# Patient Record
Sex: Female | Born: 1975 | Race: Black or African American | Hispanic: No | Marital: Married | Smoking: Never smoker
Health system: Southern US, Community
[De-identification: ages and names within clinical notes are randomized; demographics above are authoritative.]

## PROBLEM LIST (undated history)

## (undated) DIAGNOSIS — D219 Benign neoplasm of connective and other soft tissue, unspecified: Secondary | ICD-10-CM

## (undated) DIAGNOSIS — J45909 Unspecified asthma, uncomplicated: Secondary | ICD-10-CM

## (undated) HISTORY — DX: Benign neoplasm of connective and other soft tissue, unspecified: D21.9

## (undated) HISTORY — DX: Unspecified asthma, uncomplicated: J45.909

## (undated) HISTORY — PX: OTHER SURGICAL HISTORY: SHX169

---

## 1998-06-22 ENCOUNTER — Ambulatory Visit (HOSPITAL_BASED_OUTPATIENT_CLINIC_OR_DEPARTMENT_OTHER): Admission: RE | Admit: 1998-06-22 | Discharge: 1998-06-22 | Payer: Self-pay | Admitting: Orthopaedic Surgery

## 1999-01-04 ENCOUNTER — Ambulatory Visit (HOSPITAL_BASED_OUTPATIENT_CLINIC_OR_DEPARTMENT_OTHER): Admission: RE | Admit: 1999-01-04 | Discharge: 1999-01-04 | Payer: Self-pay | Admitting: Orthopaedic Surgery

## 1999-07-09 ENCOUNTER — Emergency Department (HOSPITAL_COMMUNITY): Admission: EM | Admit: 1999-07-09 | Discharge: 1999-07-09 | Payer: Self-pay | Admitting: Emergency Medicine

## 2001-11-26 ENCOUNTER — Other Ambulatory Visit: Admission: RE | Admit: 2001-11-26 | Discharge: 2001-11-26 | Payer: Self-pay | Admitting: Obstetrics and Gynecology

## 2002-03-08 ENCOUNTER — Emergency Department (HOSPITAL_COMMUNITY): Admission: EM | Admit: 2002-03-08 | Discharge: 2002-03-08 | Payer: Self-pay | Admitting: Emergency Medicine

## 2002-06-11 ENCOUNTER — Other Ambulatory Visit: Admission: RE | Admit: 2002-06-11 | Discharge: 2002-06-11 | Payer: Self-pay | Admitting: Obstetrics and Gynecology

## 2002-11-03 ENCOUNTER — Ambulatory Visit (HOSPITAL_BASED_OUTPATIENT_CLINIC_OR_DEPARTMENT_OTHER): Admission: RE | Admit: 2002-11-03 | Discharge: 2002-11-03 | Payer: Self-pay | Admitting: Orthopedic Surgery

## 2003-06-19 ENCOUNTER — Other Ambulatory Visit: Admission: RE | Admit: 2003-06-19 | Discharge: 2003-06-19 | Payer: Self-pay | Admitting: Obstetrics and Gynecology

## 2003-07-24 ENCOUNTER — Ambulatory Visit (HOSPITAL_COMMUNITY): Admission: RE | Admit: 2003-07-24 | Discharge: 2003-07-24 | Payer: Self-pay | Admitting: Family Medicine

## 2003-08-29 HISTORY — PX: WISDOM TOOTH EXTRACTION: SHX21

## 2003-09-03 ENCOUNTER — Inpatient Hospital Stay (HOSPITAL_COMMUNITY): Admission: AD | Admit: 2003-09-03 | Discharge: 2003-09-03 | Payer: Self-pay | Admitting: Obstetrics and Gynecology

## 2003-09-04 ENCOUNTER — Ambulatory Visit (HOSPITAL_COMMUNITY): Admission: RE | Admit: 2003-09-04 | Discharge: 2003-09-04 | Payer: Self-pay | Admitting: Obstetrics and Gynecology

## 2014-09-08 ENCOUNTER — Emergency Department (INDEPENDENT_AMBULATORY_CARE_PROVIDER_SITE_OTHER)
Admission: EM | Admit: 2014-09-08 | Discharge: 2014-09-08 | Disposition: A | Discharge status: Home / Self Care | Payer: Self-pay | Source: Home / Self Care | Attending: Family Medicine | Admitting: Family Medicine

## 2014-09-08 ENCOUNTER — Encounter (HOSPITAL_COMMUNITY): Payer: Self-pay | Admitting: Emergency Medicine

## 2014-09-08 DIAGNOSIS — J00 Acute nasopharyngitis [common cold]: Secondary | ICD-10-CM

## 2014-09-08 MED ORDER — BENZONATATE 100 MG PO CAPS: 100.0000 mg | ORAL_CAPSULE | Freq: Three times a day (TID) | ORAL | Status: DC | PRN

## 2014-09-08 MED ORDER — IPRATROPIUM BROMIDE 0.06 % NA SOLN: 2.0000 | Freq: Four times a day (QID) | NASAL | Status: DC

## 2014-09-08 NOTE — ED Notes (Signed)
Generally not feeling well, sore throat, head hurts, no ear pain.  Unknown if she has run a fever, no appetite, no vomiting.  Onset 2 weeks ago

## 2014-09-08 NOTE — ED Provider Notes (Signed)
CSN: [id number]     Arrival date & time 09/08/14  0109 History   First MD Initiated Contact with Patient 09/08/14 1018     Chief Complaint  Patient presents with  . URI   (Consider location/radiation/quality/duration/timing/severity/associated sxs/prior Treatment) HPI Comments: Nonsmoker Works in childcare PCP: none No flu shot this year  Patient is a 39 y.o. female presenting with URI. The history is provided by the patient.  URI Presenting symptoms: congestion, cough, fatigue and rhinorrhea   Presenting symptoms: no ear pain, no facial pain, no fever and no sore throat   Severity:  Mild Onset quality:  Gradual Duration:  2 days Timing:  Constant Progression:  Unchanged Chronicity:  New Associated symptoms: sneezing   Associated symptoms: no arthralgias, no headaches, no myalgias, no neck pain, no sinus pain, no swollen glands and no wheezing   Risk factors: sick contacts     History reviewed. No pertinent past medical history. History reviewed. No pertinent past surgical history. No family history on file. History  Substance Use Topics  . Smoking status: Never Smoker   . Smokeless tobacco: Not on file  . Alcohol Use: No   OB History    No data available     Review of Systems  Constitutional: Positive for fatigue. Negative for fever.  HENT: Positive for congestion, rhinorrhea and sneezing. Negative for ear pain and sore throat.   Respiratory: Positive for cough. Negative for wheezing.   Musculoskeletal: Negative for myalgias, arthralgias and neck pain.  Neurological: Negative for headaches.  All other systems reviewed and are negative.   Allergies  Review of patient's allergies indicates no known allergies.  Home Medications   Prior to Admission medications   Medication Sig Start Date End Date Taking? Authorizing Provider  aspirin-sod bicarb-citric acid (ALKA-SELTZER) 325 MG TBEF tablet Take 325 mg by mouth every 6 (six) hours as needed.   Yes Historical  Provider, MD  Chlorphen-Pseudoephed-APAP (TYLENOL ALLERGY SINUS PO) Take by mouth.   Yes Historical Provider, MD  guaiFENesin (TUSSIN) 100 MG/5ML liquid Take 200 mg by mouth 3 (three) times daily as needed for cough.   Yes Historical Provider, MD  benzonatate (TESSALON) 100 MG capsule Take 1 capsule (100 mg total) by mouth 3 (three) times daily as needed for cough. 09/08/14   Audelia Hives Presson, PA  ipratropium (ATROVENT) 0.06 % nasal spray Place 2 sprays into both nostrils 4 (four) times daily. For nasal congestion 09/08/14   Annett Gula H Presson, PA   BP 114/77 mmHg  Pulse 81  Temp(Src) 98.6 F (37 C) (Oral)  Resp 12  SpO2 99%  LMP 08/24/2014 Physical Exam  Constitutional: She is oriented to person, place, and time. She appears well-developed and well-nourished. No distress.  HENT:  Head: Normocephalic and atraumatic.  Right Ear: Hearing, tympanic membrane, external ear and ear canal normal.  Left Ear: Hearing, tympanic membrane, external ear and ear canal normal.  Nose: Nose normal.  Mouth/Throat: Uvula is midline, oropharynx is clear and moist and mucous membranes are normal.  Eyes: Conjunctivae are normal. No scleral icterus.  Neck: Normal range of motion. Neck supple.  Cardiovascular: Normal rate, regular rhythm and normal heart sounds.   Pulmonary/Chest: Effort normal and breath sounds normal.  Musculoskeletal: Normal range of motion.  Lymphadenopathy:    She has no cervical adenopathy.  Neurological: She is alert and oriented to person, place, and time.  Skin: Skin is warm and dry.  Psychiatric: She has a normal mood and affect. Her  behavior is normal.  Nursing note and vitals reviewed.   ED Course  Procedures (including critical care time) Labs Review Labs Reviewed - No data to display  Imaging Review No results found.   MDM   1. Common cold   Symptomatic care at home.     Lutricia Feil, [place] 09/08/14 1053

## 2014-09-08 NOTE — Discharge Instructions (Signed)
Upper Respiratory Infection, Adult An upper respiratory infection (URI) is also sometimes known as the common cold. The upper respiratory tract includes the nose, sinuses, throat, trachea, and bronchi. Bronchi are the airways leading to the lungs. Most people improve within 1 week, but symptoms can last up to 2 weeks. A residual cough may last even longer.  CAUSES Many different viruses can infect the tissues lining the upper respiratory tract. The tissues become irritated and inflamed and often become very moist. Mucus production is also common. A cold is contagious. You can easily spread the virus to others by oral contact. This includes kissing, sharing a glass, coughing, or sneezing. Touching your mouth or nose and then touching a surface, which is then touched by another person, can also spread the virus. SYMPTOMS  Symptoms typically develop 1 to 3 days after you come in contact with a cold virus. Symptoms vary from person to person. They may include:  Runny nose.  Sneezing.  Nasal congestion.  Sinus irritation.  Sore throat.  Loss of voice (laryngitis).  Cough.  Fatigue.  Muscle aches.  Loss of appetite.  Headache.  Low-grade fever. DIAGNOSIS  You might diagnose your own cold based on familiar symptoms, since most people get a cold 2 to 3 times a year. Your caregiver can confirm this based on your exam. Most importantly, your caregiver can check that your symptoms are not due to another disease such as strep throat, sinusitis, pneumonia, asthma, or epiglottitis. Blood tests, throat tests, and X-rays are not necessary to diagnose a common cold, but they may sometimes be helpful in excluding other more serious diseases. Your caregiver will decide if any further tests are required. RISKS AND COMPLICATIONS  You may be at risk for a more severe case of the common cold if you smoke cigarettes, have chronic heart disease (such as heart failure) or lung disease (such as asthma), or if  you have a weakened immune system. The very young and very old are also at risk for more serious infections. Bacterial sinusitis, middle ear infections, and bacterial pneumonia can complicate the common cold. The common cold can worsen asthma and chronic obstructive pulmonary disease (COPD). Sometimes, these complications can require emergency medical care and may be life-threatening. PREVENTION  The best way to protect against getting a cold is to practice good hygiene. Avoid oral or hand contact with people with cold symptoms. Wash your hands often if contact occurs. There is no clear evidence that vitamin C, vitamin E, echinacea, or exercise reduces the chance of developing a cold. However, it is always recommended to get plenty of rest and practice good nutrition. TREATMENT  Treatment is directed at relieving symptoms. There is no cure. Antibiotics are not effective, because the infection is caused by a virus, not by bacteria. Treatment may include:  Increased fluid intake. Sports drinks offer valuable electrolytes, sugars, and fluids.  Breathing heated mist or steam (vaporizer or shower).  Eating chicken soup or other clear broths, and maintaining good nutrition.  Getting plenty of rest.  Using gargles or lozenges for comfort.  Controlling fevers with ibuprofen or acetaminophen as directed by your caregiver.  Increasing usage of your inhaler if you have asthma. Zinc gel and zinc lozenges, taken in the first 24 hours of the common cold, can shorten the duration and lessen the severity of symptoms. Pain medicines may help with fever, muscle aches, and throat pain. A variety of non-prescription medicines are available to treat congestion and runny nose. Your caregiver   can make recommendations and may suggest nasal or lung inhalers for other symptoms.  HOME CARE INSTRUCTIONS   Only take over-the-counter or prescription medicines for pain, discomfort, or fever as directed by your  caregiver.  Use a warm mist humidifier or inhale steam from a shower to increase air moisture. This may keep secretions moist and make it easier to breathe.  Drink enough water and fluids to keep your urine clear or pale yellow.  Rest as needed.  Return to work when your temperature has returned to normal or as your caregiver advises. You may need to stay home longer to avoid infecting others. You can also use a face mask and careful hand washing to prevent spread of the virus. SEEK MEDICAL CARE IF:   After the first few days, you feel you are getting worse rather than better.  You need your caregiver's advice about medicines to control symptoms.  You develop chills, worsening shortness of breath, or brown or red sputum. These may be signs of pneumonia.  You develop yellow or brown nasal discharge or pain in the face, especially when you bend forward. These may be signs of sinusitis.  You develop a fever, swollen neck glands, pain with swallowing, or white areas in the back of your throat. These may be signs of strep throat. SEEK IMMEDIATE MEDICAL CARE IF:   You have a fever.  You develop severe or persistent headache, ear pain, sinus pain, or chest pain.  You develop wheezing, a prolonged cough, cough up blood, or have a change in your usual mucus (if you have chronic lung disease).  You develop sore muscles or a stiff neck. Document Released: 02/07/2001 Document Revised: 11/06/2011 Document Reviewed: 11/19/2013 Exit[employer] Patient Information 2015 [employer]. This information is not intended to replace advice given to you by your health care provider. Make sure you discuss any questions you have with your health care provider.  

## 2017-01-27 ENCOUNTER — Ambulatory Visit (HOSPITAL_COMMUNITY)
Admission: EM | Admit: 2017-01-27 | Discharge: 2017-01-27 | Disposition: A | Discharge status: Home / Self Care | Payer: BC Managed Care – PPO | Attending: Internal Medicine | Admitting: Internal Medicine

## 2017-01-27 ENCOUNTER — Encounter (HOSPITAL_COMMUNITY): Payer: Self-pay | Admitting: Emergency Medicine

## 2017-01-27 DIAGNOSIS — M62830 Muscle spasm of back: Secondary | ICD-10-CM

## 2017-01-27 DIAGNOSIS — M545 Low back pain, unspecified: Secondary | ICD-10-CM

## 2017-01-27 MED ORDER — METHYLPREDNISOLONE ACETATE 80 MG/ML IJ SUSP
INTRAMUSCULAR | Status: AC
  Filled 2017-01-27: qty 1

## 2017-01-27 MED ORDER — KETOROLAC TROMETHAMINE 60 MG/2ML IM SOLN
60.0000 mg | Freq: Once | INTRAMUSCULAR | Status: AC
  Administered 2017-01-27: 60 mg via INTRAMUSCULAR

## 2017-01-27 MED ORDER — METHYLPREDNISOLONE ACETATE 80 MG/ML IJ SUSP
80.0000 mg | Freq: Once | INTRAMUSCULAR | Status: AC
  Administered 2017-01-27: 80 mg via INTRAMUSCULAR

## 2017-01-27 MED ORDER — CYCLOBENZAPRINE HCL 5 MG PO TABS: ORAL_TABLET | ORAL | 0 refills | Status: DC

## 2017-01-27 MED ORDER — HYDROCODONE-ACETAMINOPHEN 5-325 MG PO TABS: 1.0000 | ORAL_TABLET | Freq: Four times a day (QID) | ORAL | 0 refills | Status: DC | PRN

## 2017-01-27 MED ORDER — KETOROLAC TROMETHAMINE 60 MG/2ML IM SOLN
INTRAMUSCULAR | Status: AC
  Filled 2017-01-27: qty 2

## 2017-01-27 NOTE — ED Provider Notes (Signed)
CSN: [id number]     Arrival date & time 01/27/17  1559 History   First MD Initiated Contact with Patient 01/27/17 1623     Chief Complaint  Patient presents with  . Back Pain   (Consider location/radiation/quality/duration/timing/severity/associated sxs/prior Treatment) 41 yo black female presents with low back pain that began this morning after a "hard sneeze". She notes severe pain across lower back. No radiation into lower extremities. No numbness, tingling or weakness. She has pain with any movement. No prior history of back pain or injury .No loss of bowel or bladder control.       History reviewed. No pertinent past medical history. History reviewed. No pertinent surgical history. No family history on file. Social History  Substance Use Topics  . Smoking status: Never Smoker  . Smokeless tobacco: Never Used  . Alcohol use No   OB History    No data available     Review of Systems  Musculoskeletal: Positive for back pain.  Skin: Negative.   Neurological: Negative for weakness.    Allergies  Patient has no known allergies.  Home Medications   Prior to Admission medications   Medication Sig Start Date End Date Taking? Authorizing Provider  aspirin-sod bicarb-citric acid (ALKA-SELTZER) 325 MG TBEF tablet Take 325 mg by mouth every 6 (six) hours as needed.    [provider]  benzonatate (TESSALON) 100 MG capsule Take 1 capsule (100 mg total) by mouth 3 (three) times daily as needed for cough. 09/08/14   Presson, Audelia Hives, PA  Chlorphen-Pseudoephed-APAP (TYLENOL ALLERGY SINUS PO) Take by mouth.    [provider]  cyclobenzaprine (FLEXERIL) 5 MG tablet 1-2 po every 8 hours as needed for muscle spasms 01/27/17   Bjorn Pippin, PA-C  guaiFENesin (TUSSIN) 100 MG/5ML liquid Take 200 mg by mouth 3 (three) times daily as needed for cough.    [provider]  HYDROcodone-acetaminophen (NORCO/VICODIN) 5-325 MG tablet Take 1-2 tablets by mouth  every 6 (six) hours as needed for severe pain. 01/27/17   Bjorn Pippin, PA-C  ipratropium (ATROVENT) 0.06 % nasal spray Place 2 sprays into both nostrils 4 (four) times daily. For nasal congestion 09/08/14   Presson, Audelia Hives, PA   Meds Ordered and Administered this Visit   Medications  methylPREDNISolone acetate (DEPO-MEDROL) injection 80 mg (not administered)  ketorolac (TORADOL) injection 60 mg (not administered)    BP 125/81 (BP Location: Left Arm)   Pulse 78   Temp 98.7 F (37.1 C) (Oral)   Resp 20   LMP 01/24/2017   SpO2 100%  No data found.   Physical Exam  Constitutional: She is oriented to person, place, and time. She appears well-developed and well-nourished.  Patient appears in pain given position on exam table and inability to get comfortable  Musculoskeletal: She exhibits no tenderness or deformity.  Decrease ROM of the lumbar spine due to pain and poor effort in the setting of pain. No specific spasm noted, no spinal tenderness. Negative SLR  Neurological: She is alert and oriented to person, place, and time. She displays normal reflexes. She exhibits normal muscle tone. Coordination normal.  Motor 5/5 BLE  Skin: Skin is warm and dry.  Psychiatric: Her behavior is normal.  Nursing note and vitals reviewed.   Urgent Care Course     Procedures (including critical care time)  Labs Review Labs Reviewed - No data to display  Imaging Review No results found.   Visual Acuity Review  Right  Eye Distance:   Left Eye Distance:   Bilateral Distance:    Right Eye Near:   Left Eye Near:    Bilateral Near:         MDM   1. Muscle spasm of back   2. Acute midline low back pain without sciatica    No neuro deficit. Treat acutely with 80 mg IM DepoMedrol, 60 mg IM Toradol accompanied by Norco and Flexeril as needed. Discussed gentle stretching as well. If she worsens f/u with Orthopedics may need PT as well.     Bjorn Pippin, [place] 01/27/17  1651

## 2017-01-27 NOTE — ED Triage Notes (Signed)
Here for lower back back pain since 0900.... sts she sneezed and felt the pain since   Pain increases w/activity  A&O x4... NAD... Ambulatory

## 2017-01-27 NOTE — Discharge Instructions (Signed)
Lay on a hard surface, Biofreeze OTC is good and ice to heat to use as well. The shots will help with inflammation. Also may use Ibuprofen 800 mg every 8 hours (start tomorrow) for inflammation, use Flexeril as needed for spasms and Norco for acute pain. If you continue to worsen then f/u with Orthopedics for further evaluation and possible physical therapy.

## 2017-02-02 ENCOUNTER — Ambulatory Visit (INDEPENDENT_AMBULATORY_CARE_PROVIDER_SITE_OTHER): Payer: Self-pay

## 2017-02-02 ENCOUNTER — Ambulatory Visit (INDEPENDENT_AMBULATORY_CARE_PROVIDER_SITE_OTHER): Payer: BC Managed Care – PPO | Admitting: Family

## 2017-02-02 VITALS — Ht 61.0 in | Wt 130.0 lb

## 2017-02-02 DIAGNOSIS — M545 Low back pain, unspecified: Secondary | ICD-10-CM

## 2017-02-02 MED ORDER — HYDROCODONE-ACETAMINOPHEN 5-325 MG PO TABS: 1.0000 | ORAL_TABLET | Freq: Four times a day (QID) | ORAL | 0 refills | Status: DC | PRN

## 2017-02-02 NOTE — Progress Notes (Signed)
Office Visit Note   Patient: Rachel Lawrence           Date of Birth: 12/05/1975           MRN: [id number] Visit Date: 02/02/2017              Requested by: Velna Hatchet, [place], [place] [address] PCP: [facility] [place]  Chief Complaint  Patient presents with  . Lower Back - Pain    Sneezed last Friday acute onset LBP      HPI: Patient is a 41 year old woman who presents today complaining of low back pain. She did have an emergency room visit about a week ago for the same. States that she did not have an injury but did sneeze and felt something pop in her back has had pain since. 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 Feel she may have strained her back at work had a child on her lap for quite some time about last week. [facility] did provide her with per prescription for hydrocodone and Flexeril did also have a Decadron injection at that time. Feels these have not been helpful in reducing her pain. Complains that the muscle relaxer just makes her sleepy. She did see her primary care provider this week for continued pain they recommended heat provided her with a prescription for meloxicam and a muscle relaxer that is less sedating and Flexeril. Did refer her to [place]. Denies any radicular symptoms or burning pain in her legs no numbness no tingling no weakness. When asked of either of her legs feel heavy. She states her entire body feels heavy. Complaining of pain that wraps around her hips transverse across her low back.  Assessment & Plan: Visit Diagnoses:  1. Acute bilateral low back pain without sciatica     Plan: Have recommended she continue use he recommended against a back brace. Did provide a prescription for physical therapy. We'll provide her with one more short course of hydrocodone. She will continue with the meloxicam follow-up in office in 4 weeks if  having continued pain may consider MRI at that time.  Follow-Up Instructions: Return in about 4 weeks (around 03/02/2017).   Back Exam   Tenderness  The patient is experiencing tenderness in the lumbar.  Muscle Strength  The patient has normal back strength.  Other  Gait: normal   Comments:  Straight leg raises positive on exam, pain out of proportion to findings.       Patient is alert, oriented, no adenopathy, well-dressed, normal affect, normal respiratory effort.   Imaging: Xr Lumbar Spine 2-3 Views  Result Date: 02/02/2017 Radiographs of the lumbar spine are negative for spondylosis. No spondylolisthesis. No acute finding.   Labs: No results found for: HGBA1C, ESRSEDRATE, CRP, LABURIC, REPTSTATUS, GRAMSTAIN, CULT, LABORGA  Orders:  Orders Placed This Encounter  Procedures  . XR Lumbar Spine 2-3 Views  . Ambulatory referral to Physical Therapy   Meds ordered this encounter  Medications  . HYDROcodone-acetaminophen (NORCO/VICODIN) 5-325 MG tablet    Sig: Take 1 tablet by mouth every 6 (six) hours as needed for severe pain.    Dispense:  40 tablet    Refill:  0     Procedures: No procedures performed  Clinical Data: No additional findings.  ROS:  All other systems negative, except as noted in the HPI. Review of Systems  Constitutional: Negative for chills and fever.  Musculoskeletal: Positive for back pain and myalgias.  Neurological: Negative for weakness and numbness.    Objective: Vital Signs: Ht 5\' 1"  (1.549 m)   Wt 130 lb (59 kg)   LMP 01/24/2017   BMI 24.56 kg/m   Specialty Comments:  No specialty comments available.  PMFS History: There are no active problems to display for this patient.  No past medical history on file.  No family history on file.  No past surgical history on file. Social History   Occupational History  . Not on file.   Social History Main Topics  . Smoking status: Never Smoker  . Smokeless tobacco: Never  Used  . Alcohol use No  . Drug use: No  . Sexual activity: Not on file

## 2017-02-06 ENCOUNTER — Telehealth (INDEPENDENT_AMBULATORY_CARE_PROVIDER_SITE_OTHER): Payer: Self-pay | Admitting: Radiology

## 2017-02-06 NOTE — Telephone Encounter (Signed)
Patient called in reference to the hydrocodone prescribed on 02/02/17.  She went to pick it up at pharmacy and was told they were waiting on the insurance.  She is requesting a call back on status. Call back number [phone].

## 2017-02-06 NOTE — Telephone Encounter (Signed)
[employer] has denied the medication stating that if it is not treating terminal cancer, treatment while in hospice, moderate to severe pain for chronic pain or less than 7 days of acute pain it will not cover. Pt voiced understanding. Advised to continue with the mobic, physical therapy and at the end of her sessions return as advised and for follow up and if MRI is needed can set up at that time.

## 2017-02-06 NOTE — Telephone Encounter (Signed)
Prior auth for this medication was sent through cover my meds this morning. Usually  have a 24 hr turn around time. Called pt to advise and lm on vm once [employer] has made decision I will call to make pt aware.

## 2017-02-14 ENCOUNTER — Ambulatory Visit: Payer: BC Managed Care – PPO | Admitting: Physical Therapy

## 2017-02-20 ENCOUNTER — Encounter: Payer: Self-pay | Admitting: Physical Therapy

## 2017-02-20 ENCOUNTER — Ambulatory Visit: Payer: BC Managed Care – PPO | Attending: Family | Admitting: Physical Therapy

## 2017-02-20 DIAGNOSIS — M62830 Muscle spasm of back: Secondary | ICD-10-CM

## 2017-02-20 DIAGNOSIS — G8929 Other chronic pain: Secondary | ICD-10-CM

## 2017-02-20 DIAGNOSIS — M545 Low back pain: Secondary | ICD-10-CM | POA: Diagnosis present

## 2017-02-20 NOTE — Therapy (Addendum)
Rachel Lawrence, [place], [address] Phone: [phone]   Fax:  [phone]  Physical Therapy Evaluation/ Discharge Summary  Patient Details  Name: Rachel Lawrence MRN: [id number] Date of Birth: 24-Jan-1976 Referring Provider: Suzan Slick, NP  Encounter Date: 02/20/2017      PT End of Session - 02/20/17 1554    Visit Number 1   Number of Visits 8   Date for PT Re-Evaluation 04/17/17   PT Start Time 1500   PT Stop Time 5170   PT Time Calculation (min) 44 min   Activity Tolerance Patient tolerated treatment well   Behavior During Therapy [facility] for tasks assessed/performed      History reviewed. No pertinent past medical history.  History reviewed. No pertinent surgical history.  There were no vitals filed for this visit.       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 pain started across the entire lower back but can fluctuate from R<>L. worse with getting out of bed in the moring. denies N/T or referred pain. Denies incontinence and saddle paresthesia.     Limitations Other (comment)   How long can you sit comfortably? unlimited   How long can you stand comfortably? unlimited   How long can you walk comfortably? unlimited   Diagnostic tests x-ray    Patient Stated Goals figure out what to do when the stiffness sets in, decrease  pain, return to exercise    Currently in Pain? Yes   Pain Score 5   at worst 1010   Pain Location Back   Pain Orientation Mid;Lower   Pain Descriptors / Indicators Sharp;Throbbing;Aching   Pain Type Chronic pain   Pain Onset More than a month ago   Pain Frequency Intermittent   Aggravating Factors  getting up the morning, prolong sitting, bending,    Pain Relieving Factors medications, ice,              [facility] PT Assessment - 02/20/17 1518      Assessment   Medical Diagnosis Low back pain with LE referral   Referring Provider Rachel Slick, NP   Onset Date/Surgical Date --  1 month   Hand Dominance Right   Next MD Visit --  make on PRN   Prior Therapy no     Precautions   Precautions None     Restrictions   Weight Bearing Restrictions No     Balance Screen   Has the patient fallen in the past 6 months No   Has the patient had a decrease in activity level because of a fear of falling?  No   Is the patient reluctant to leave their home because of a fear of falling?  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 bending    Leisure exercise,  staying at home, movies,      Cognition   Overall Cognitive Status Within Functional Limits for tasks assessed     Observation/Other Assessments   Focus on Therapeutic Outcomes (FOTO)  35% limited  predicted 18% limited     Posture/Postural Control   Posture/Postural Control Postural limitations   Postural Limitations Rounded Shoulders;Forward head     ROM / Strength   AROM / PROM / Strength AROM;Strength     AROM   AROM Assessment Site Lumbar   Lumbar Flexion 100  end range tightness   Lumbar Extension 30  during motion   Lumbar - Right Side Bend 10  pain during motion   Lumbar - Left Side Bend 10     Strength   Overall Strength Within functional limits for tasks performed   Strength Assessment Site Hip;Knee   Right/Left Hip Right;Left     Palpation   Palpation comment TTP along bil lumbar paraspinals and bil QL       Special Tests    Special Tests Lumbar   Lumbar Tests Slump Test;Straight Leg Raise;other     Ambulation/Gait   Gait Pattern Step-through pattern            Objective measurements completed on examination: See above findings.          [facility] Adult PT Treatment/Exercise - 02/20/17 1518      Lumbar Exercises: Stretches   Pelvic Tilt --  1 x 10 with ADIM   Prone Mid Back Stretch 2 reps;30 seconds  childs pose stretch     Knee/Hip Exercises: Stretches   Hip Flexor Stretch 2 reps;30 seconds     Manual Therapy   Manual therapy comments manual trigger point relase bil lumbar paraspinals                PT Education - 02/20/17 1554    Education provided Yes   Education Details evaluation findings, POC, goals, HEP with proper form / rationale, anatomy of area involved.    Person(s) Educated Patient   Methods Explanation;Demonstration;Verbal cues;Handout   Comprehension Verbalized understanding;Returned demonstration;Verbal cues required          PT Short Term Goals - 02/20/17 1640      PT SHORT TERM GOAL #1   Title pt will be I with inital HEP (03/22/2017)   Time 4   Period Weeks   Status New     PT SHORT TERM GOAL #2   Title pt to reduce trunk muscle spasm to reduce pain to </= 4/10 with movement and promote trunk mobility (03/22/2017)   Time 4   Period Weeks   Status New           PT Long Term Goals - 02/20/17 1641      PT LONG TERM GOAL #1   Title pt to demo proper posture and lifting and carrying mechancics to prevent and reduce low back pain (04/17/2017)   Time 8   Period Weeks   Status New     PT LONG TERM GOAL #2   Title increase FOTO score to </= 18% limited to demo improvement in function ((04/17/2017)   Time 8   Period Weeks   Status New     PT LONG TERM GOAL #3   Title pt will be able to return to regular gym exercise with no report of low back pain per pt's personal goal for improvement in QOL ((04/17/2017)   Time 8   Period Weeks  Status New     PT LONG TERM GOAL #4   Title she will be I with all HEP given as of last visit ((04/17/2017)   Time [place]   Status New                Plan - 02/20/17 1636    Clinical Impression Statement Ms. Rachel Lawrence presents to OPPT with CC of low back pain that started about 1 month ago due to a sneeze. trunk mobility is WNL with pain noted at end ranges. TTP along bil thoracolumbar paraspinals. following manual trigger point release and stretching she reported significant relief of pain and tightness. she would benefit from physical therapy to reduce muscle tightness, improve pain free mobility, promote proper posture and mechanics and return pt to PLOF by addressing the deficits listed.    Clinical Presentation Stable   Clinical Decision Making Low   Rehab Potential Good   PT Frequency 1x / week   PT Duration 8 weeks   PT Treatment/Interventions ADLs/Self Care Home Management;Patient/family education;Dry needling;Taping;Therapeutic activities;Therapeutic exercise;Moist Heat;Ultrasound;Cryotherapy;Electrical Stimulation;Iontophoresis 89m/ml Dexamethasone;Manual techniques;Passive range of motion   PT Next Visit Plan assess/review HEP and update PRN, DN? posture education, lifting/ carrying mechancis, lumbar mobs, core strengthening,    PT Home Exercise Plan hip flexor stretching, childs pose, pelvic tilt, lower trunk rotation   Consulted and Agree with Plan of Care Patient      Patient will benefit from skilled therapeutic intervention in order to improve the following deficits and impairments:  Increased muscle spasms, Pain, Improper body mechanics, Postural dysfunction, Decreased endurance, Decreased activity tolerance  Visit Diagnosis: Chronic bilateral low back pain without sciatica - Plan: PT plan of care cert/re-cert  Muscle spasm of back - Plan: PT plan of care cert/re-cert     Problem List There are no active problems to display for this  patient.  KStarr LakePT, DPT, LAT, ATC  02/20/17  4:52 PM      [facility] [address]Phone: [phone]  Fax:  [phone] Name: Rachel PASSMOREMRN: [id number] of Birth: 106-20-1977    PHYSICAL THERAPY DISCHARGE SUMMARY  Visits from Start of Care: 1  Current functional level related to goals / functional outcomes: See goals   Remaining deficits: unknown   Education / Equipment: HEP  Plan: Patient agrees to discharge.  Patient goals were not met. Patient is being discharged due to not returning since the last visit.  ?????     Marcella Charlson PT, DPT, LAT, ATC  04/12/17  10:39 AM

## 2017-02-23 ENCOUNTER — Telehealth (INDEPENDENT_AMBULATORY_CARE_PROVIDER_SITE_OTHER): Payer: Self-pay | Admitting: Family

## 2017-02-23 ENCOUNTER — Ambulatory Visit (INDEPENDENT_AMBULATORY_CARE_PROVIDER_SITE_OTHER): Payer: BC Managed Care – PPO | Admitting: Family

## 2017-02-23 NOTE — Telephone Encounter (Signed)
[facility] called asking for OV notes from [facility] 02-02-17 to be faxed over to [phone]

## 2017-02-26 NOTE — Telephone Encounter (Signed)
done

## 2017-03-06 ENCOUNTER — Ambulatory Visit: Payer: BC Managed Care – PPO | Admitting: Physical Therapy

## 2017-03-12 ENCOUNTER — Encounter: Payer: BC Managed Care – PPO | Admitting: Physical Therapy

## 2017-03-21 ENCOUNTER — Encounter: Payer: BC Managed Care – PPO | Admitting: Physical Therapy

## 2017-03-26 ENCOUNTER — Encounter: Payer: BC Managed Care – PPO | Admitting: Physical Therapy

## 2019-04-03 ENCOUNTER — Other Ambulatory Visit: Payer: Self-pay

## 2019-04-03 DIAGNOSIS — Z20822 Contact with and (suspected) exposure to covid-19: Secondary | ICD-10-CM

## 2019-04-04 LAB — SPECIMEN STATUS REPORT

## 2019-04-04 LAB — NOVEL CORONAVIRUS, NAA: SARS-CoV-2, NAA: NOT DETECTED

## 2019-07-23 ENCOUNTER — Other Ambulatory Visit: Payer: Self-pay

## 2019-07-23 DIAGNOSIS — Z20822 Contact with and (suspected) exposure to covid-19: Secondary | ICD-10-CM

## 2019-07-24 LAB — NOVEL CORONAVIRUS, NAA: SARS-CoV-2, NAA: NOT DETECTED

## 2019-09-12 ENCOUNTER — Ambulatory Visit: Payer: BC Managed Care – PPO | Attending: Internal Medicine

## 2019-09-12 DIAGNOSIS — Z20822 Contact with and (suspected) exposure to covid-19: Secondary | ICD-10-CM | POA: Insufficient documentation

## 2019-09-14 LAB — NOVEL CORONAVIRUS, NAA: SARS-CoV-2, NAA: NOT DETECTED

## 2019-12-12 ENCOUNTER — Other Ambulatory Visit: Payer: Self-pay

## 2019-12-12 ENCOUNTER — Ambulatory Visit: Payer: BC Managed Care – PPO | Admitting: Cardiology

## 2019-12-12 ENCOUNTER — Encounter: Payer: Self-pay | Admitting: Cardiology

## 2019-12-12 VITALS — BP 108/68 | HR 72 | Temp 97.4°F | Ht 61.75 in | Wt 133.6 lb

## 2019-12-12 DIAGNOSIS — N979 Female infertility, unspecified: Secondary | ICD-10-CM

## 2019-12-12 DIAGNOSIS — Z136 Encounter for screening for cardiovascular disorders: Secondary | ICD-10-CM

## 2019-12-12 NOTE — Progress Notes (Signed)
Cardiology Office Note:    Date:  12/13/2019   ID:  Rachel Lawrence, DOB 1976-02-24, MRN [id number]  PCP:  [facility] [place]  Cardiologist:  No primary care provider on file.  Electrophysiologist:  None   Referring MD: Velna Hatchet, MD   Chief Complaint  Patient presents with  . Infertility    History of Present Illness:    Rachel Lawrence is a 44 y.o. female with ano pmhx who is reffered by Dr Ardeth Perfect for ischemia evaluation prior to starting infertility program.  She is seeing an infertility specialist, and given her age, reports stress test is required prior to starting infertility program.  She goes to the [facility] 3 days/week for group exercise classes, will typically do whole body workouts for 45 minutes.  She denies any exertional chest pain or dyspnea.  Denies any lightheadedness, palpitations, lower extremity edema, or syncope.  No smoking history.  No history of heart disease in her immediate family.  No past medical history on file.  No past surgical history on file.  Current Medications: Current Meds  Medication Sig  . Prenatal Vit-Fe Fumarate-FA (MULTIVITAMIN-PRENATAL) 27-0.8 MG TABS tablet Take 1 tablet by mouth daily at 12 noon. 1 tablet QD     Allergies:   Patient has no known allergies.   Social History   Socioeconomic History  . Marital status: Married    Spouse name: Not on file  . Number of children: Not on file  . Years of education: Not on file  . Highest education level: Not on file  Occupational History  . Not on file  Tobacco Use  . Smoking status: Never Smoker  . Smokeless tobacco: Never Used  Substance and Sexual Activity  . Alcohol use: No  . Drug use: No  . Sexual activity: Not on file  Other Topics Concern  . Not on file  Social History Narrative  . Not on file   Social Determinants of Health   Financial Resource Strain:   . Difficulty of Paying Living Expenses:   Food Insecurity:   . Worried About Charity fundraiser in  the Last Year:   . Arboriculturist in the Last Year:   Transportation Needs:   . Film/video editor (Medical):   Marland Kitchen Lack of Transportation (Non-Medical):   Physical Activity:   . Days of Exercise per Week:   . Minutes of Exercise per Session:   Stress:   . Feeling of Stress :   Social Connections:   . Frequency of Communication with Friends and Family:   . Frequency of Social Gatherings with Friends and Family:   . Attends Religious Services:   . Active Member of Clubs or Organizations:   . Attends Archivist Meetings:   Marland Kitchen Marital Status:      Family History: No history of heart disease in her immediate family  ROS:   Please see the history of present illness.    All other systems reviewed and are negative.  EKGs/Labs/Other Studies Reviewed:    The following studies were reviewed today:   EKG:  EKG is ordered today.  The ekg ordered today demonstrates normal sinus rhythm, rate 72, no ST/T wave abnormalities  Recent Labs: No results found for requested labs within last 8760 hours.  Recent Lipid Panel No results found for: CHOL, TRIG, HDL, CHOLHDL, VLDL, LDLCALC, LDLDIRECT  Physical Exam:    VS:  BP 108/68   Pulse 72   Temp (!) 97.4  F (36.3 C)   Ht 5' 1.75" (1.568 m)   Wt 133 lb 9.6 oz (60.6 kg)   SpO2 99%   BMI 24.63 kg/m     Wt Readings from Last 3 Encounters:  12/12/19 133 lb 9.6 oz (60.6 kg)  02/02/17 130 lb (59 kg)     GEN: Well nourished, well developed in no acute distress HEENT: Normal NECK: No JVD LYMPHATICS: No lymphadenopathy CARDIAC:RRR, no murmurs, rubs, gallops RESPIRATORY:  Clear to auscultation without rales, wheezing or rhonchi  ABDOMEN: Soft, non-tender, non-distended MUSCULOSKELETAL:  No edema; No deformity  SKIN: Warm and dry NEUROLOGIC:  Alert and oriented x 3 PSYCHIATRIC:  Normal affect   ASSESSMENT:    1. Infertility, female   2. Screening for cardiovascular condition    PLAN:     Infertility: She is seeing  an infertility specialist, but given her age, stress test is required to rule out ischemia prior to enrolling in the infertility program.  No exertional symptoms to suggest ischemia.  Will evaluate further with ETT  RTC as needed   Medication Adjustments/Labs and Tests Ordered: Current medicines are reviewed at length with the patient today.  Concerns regarding medicines are outlined above.  Orders Placed This Encounter  Procedures  . EXERCISE TOLERANCE TEST (ETT)  . EKG 12-Lead   No orders of the defined types were placed in this encounter.   Patient Instructions  Medication Instructions:  Your physician recommends that you continue on your current medications as directed. Please refer to the Current Medication list given to you today.  Testing/Procedures: Your physician has requested that you have an exercise tolerance test. For further information please visit [email or web address]. Please also follow instruction sheet, as given. --this will require a covid test 3 days prior-we will schedule this for you.  Follow-Up: At [facility], you and your health needs are our priority.  As part of our continuing mission to provide you with exceptional heart care, we have created designated Provider Care Teams.  These Care Teams include your primary Cardiologist (physician) and Advanced Practice Providers (APPs -  Physician Assistants and Nurse Practitioners) who all work together to provide you with the care you need, when you need it.  We recommend signing up for the patient portal called "MyChart".  Sign up information is provided on this After Visit Summary.  MyChart is used to connect with patients for Virtual Visits (Telemedicine).  Patients are able to view lab/test results, encounter notes, upcoming appointments, etc.  Non-urgent messages can be sent to your provider as well.   To learn more about what you can do with MyChart, go to [email or web address].    Your next appointment:     As needed with Dr. Gardiner Rhyme     Signed, Donato Heinz, MD  12/13/2019 10:09 PM    [place]

## 2019-12-12 NOTE — Patient Instructions (Signed)
Medication Instructions:  Your physician recommends that you continue on your current medications as directed. Please refer to the Current Medication list given to you today.  Testing/Procedures: Your physician has requested that you have an exercise tolerance test. For further information please visit [email or web address]. Please also follow instruction sheet, as given. --this will require a covid test 3 days prior-we will schedule this for you.  Follow-Up: At [facility], you and your health needs are our priority.  As part of our continuing mission to provide you with exceptional heart care, we have created designated Provider Care Teams.  These Care Teams include your primary Cardiologist (physician) and Advanced Practice Providers (APPs -  Physician Assistants and Nurse Practitioners) who all work together to provide you with the care you need, when you need it.  We recommend signing up for the patient portal called "MyChart".  Sign up information is provided on this After Visit Summary.  MyChart is used to connect with patients for Virtual Visits (Telemedicine).  Patients are able to view lab/test results, encounter notes, upcoming appointments, etc.  Non-urgent messages can be sent to your provider as well.   To learn more about what you can do with MyChart, go to [email or web address].    Your next appointment:   As needed with Dr. Gardiner Rhyme

## 2019-12-15 ENCOUNTER — Encounter: Payer: Self-pay | Admitting: Cardiology

## 2019-12-15 ENCOUNTER — Telehealth: Payer: Self-pay | Admitting: Cardiology

## 2019-12-15 NOTE — Telephone Encounter (Signed)
Left message for patient to call and schedule ETT and COVID screeniing ordered by Dr. Gardiner Rhyme

## 2019-12-25 ENCOUNTER — Telehealth (HOSPITAL_COMMUNITY): Payer: Self-pay

## 2019-12-25 NOTE — Telephone Encounter (Signed)
Encounter complete. 

## 2019-12-26 ENCOUNTER — Other Ambulatory Visit (HOSPITAL_COMMUNITY)
Admission: RE | Admit: 2019-12-26 | Discharge: 2019-12-26 | Disposition: A | Discharge status: Home / Self Care | Payer: BC Managed Care – PPO | Source: Ambulatory Visit | Attending: Cardiology | Admitting: Cardiology

## 2019-12-26 DIAGNOSIS — Z01812 Encounter for preprocedural laboratory examination: Secondary | ICD-10-CM | POA: Diagnosis present

## 2019-12-26 DIAGNOSIS — Z20822 Contact with and (suspected) exposure to covid-19: Secondary | ICD-10-CM | POA: Diagnosis not present

## 2019-12-27 LAB — SARS CORONAVIRUS 2 (TAT 6-24 HRS): SARS Coronavirus 2: NEGATIVE

## 2019-12-30 ENCOUNTER — Other Ambulatory Visit: Payer: Self-pay

## 2019-12-30 ENCOUNTER — Ambulatory Visit (HOSPITAL_COMMUNITY)
Admission: RE | Admit: 2019-12-30 | Discharge: 2019-12-30 | Disposition: A | Discharge status: Home / Self Care | Payer: BC Managed Care – PPO | Source: Ambulatory Visit | Attending: Cardiovascular Disease | Admitting: Cardiovascular Disease

## 2019-12-30 DIAGNOSIS — Z136 Encounter for screening for cardiovascular disorders: Secondary | ICD-10-CM | POA: Insufficient documentation

## 2019-12-30 DIAGNOSIS — N979 Female infertility, unspecified: Secondary | ICD-10-CM | POA: Insufficient documentation

## 2019-12-30 LAB — EXERCISE TOLERANCE TEST
Estimated workload: 10.1 METS
Exercise duration (min): 9 min
Exercise duration (sec): 0 s
MPHR: 175 {beats}/min
Peak HR: 157 {beats}/min
Percent HR: 89 %
Rest HR: 72 {beats}/min

## 2020-05-13 ENCOUNTER — Ambulatory Visit
Admission: RE | Admit: 2020-05-13 | Discharge status: Absent without leave | Payer: BC Managed Care – PPO | Source: Ambulatory Visit

## 2020-07-05 LAB — OB RESULTS CONSOLE HIV ANTIBODY (ROUTINE TESTING): HIV: NONREACTIVE

## 2020-07-05 LAB — OB RESULTS CONSOLE RUBELLA ANTIBODY, IGM: Rubella: IMMUNE

## 2020-07-05 LAB — OB RESULTS CONSOLE HEPATITIS B SURFACE ANTIGEN: Hepatitis B Surface Ag: NEGATIVE

## 2020-11-29 ENCOUNTER — Other Ambulatory Visit: Payer: Self-pay | Admitting: Obstetrics and Gynecology

## 2020-11-29 DIAGNOSIS — Z363 Encounter for antenatal screening for malformations: Secondary | ICD-10-CM

## 2020-12-07 ENCOUNTER — Encounter: Payer: Self-pay | Admitting: *Deleted

## 2020-12-08 ENCOUNTER — Ambulatory Visit: Payer: BC Managed Care – PPO | Admitting: *Deleted

## 2020-12-08 ENCOUNTER — Other Ambulatory Visit: Payer: Self-pay

## 2020-12-08 ENCOUNTER — Ambulatory Visit: Payer: BC Managed Care – PPO | Attending: Obstetrics and Gynecology

## 2020-12-08 VITALS — BP 121/72 | HR 101

## 2020-12-08 DIAGNOSIS — O26843 Uterine size-date discrepancy, third trimester: Secondary | ICD-10-CM

## 2020-12-08 DIAGNOSIS — Z3A33 33 weeks gestation of pregnancy: Secondary | ICD-10-CM

## 2020-12-08 DIAGNOSIS — O3413 Maternal care for benign tumor of corpus uteri, third trimester: Secondary | ICD-10-CM | POA: Diagnosis not present

## 2020-12-08 DIAGNOSIS — D259 Leiomyoma of uterus, unspecified: Secondary | ICD-10-CM

## 2020-12-08 DIAGNOSIS — Z363 Encounter for antenatal screening for malformations: Secondary | ICD-10-CM | POA: Insufficient documentation

## 2020-12-08 DIAGNOSIS — O09523 Supervision of elderly multigravida, third trimester: Secondary | ICD-10-CM | POA: Insufficient documentation

## 2020-12-08 DIAGNOSIS — D569 Thalassemia, unspecified: Secondary | ICD-10-CM

## 2020-12-08 DIAGNOSIS — O99013 Anemia complicating pregnancy, third trimester: Secondary | ICD-10-CM

## 2020-12-23 LAB — OB RESULTS CONSOLE GBS: GBS: NEGATIVE

## 2021-01-11 ENCOUNTER — Other Ambulatory Visit: Payer: Self-pay

## 2021-01-11 ENCOUNTER — Ambulatory Visit: Payer: BC Managed Care – PPO | Attending: Obstetrics and Gynecology

## 2021-01-11 ENCOUNTER — Other Ambulatory Visit: Payer: Self-pay | Admitting: *Deleted

## 2021-01-11 ENCOUNTER — Encounter: Payer: Self-pay | Admitting: *Deleted

## 2021-01-11 ENCOUNTER — Ambulatory Visit: Payer: BC Managed Care – PPO | Admitting: *Deleted

## 2021-01-11 VITALS — BP 139/74 | HR 90

## 2021-01-11 DIAGNOSIS — O3413 Maternal care for benign tumor of corpus uteri, third trimester: Secondary | ICD-10-CM | POA: Diagnosis not present

## 2021-01-11 DIAGNOSIS — Z3A37 37 weeks gestation of pregnancy: Secondary | ICD-10-CM

## 2021-01-11 DIAGNOSIS — O09523 Supervision of elderly multigravida, third trimester: Secondary | ICD-10-CM

## 2021-01-11 DIAGNOSIS — O322XX0 Maternal care for transverse and oblique lie, not applicable or unspecified: Secondary | ICD-10-CM

## 2021-01-11 DIAGNOSIS — D649 Anemia, unspecified: Secondary | ICD-10-CM

## 2021-01-11 DIAGNOSIS — D259 Leiomyoma of uterus, unspecified: Secondary | ICD-10-CM | POA: Diagnosis not present

## 2021-01-11 DIAGNOSIS — O99013 Anemia complicating pregnancy, third trimester: Secondary | ICD-10-CM

## 2021-01-11 DIAGNOSIS — Z362 Encounter for other antenatal screening follow-up: Secondary | ICD-10-CM | POA: Diagnosis not present

## 2021-01-11 DIAGNOSIS — O26843 Uterine size-date discrepancy, third trimester: Secondary | ICD-10-CM

## 2021-01-11 DIAGNOSIS — D251 Intramural leiomyoma of uterus: Secondary | ICD-10-CM | POA: Insufficient documentation

## 2021-01-11 IMAGING — US US MFM OB FOLLOW-UP
1 series · 13 of 28 positions shown · non-contrast
Comparison: none

[Series 1: us mfm ob follow-up · 38 acquisitions, 13 frames shown]
[im 2/38]
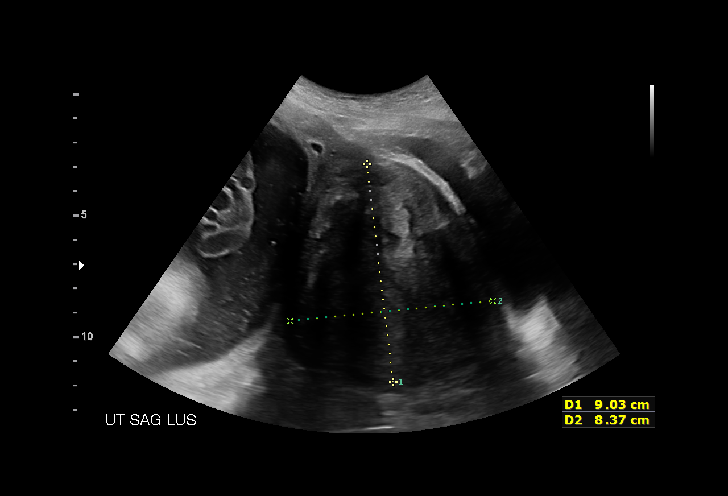
[im 5/38]
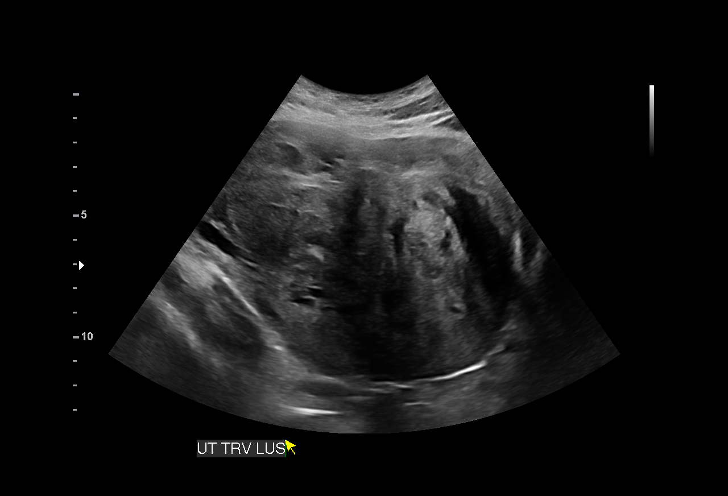
[im 7/38]
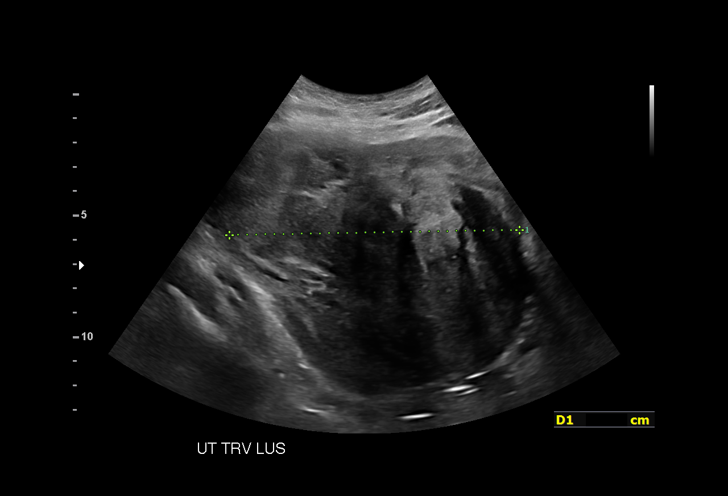
[im 10/38]
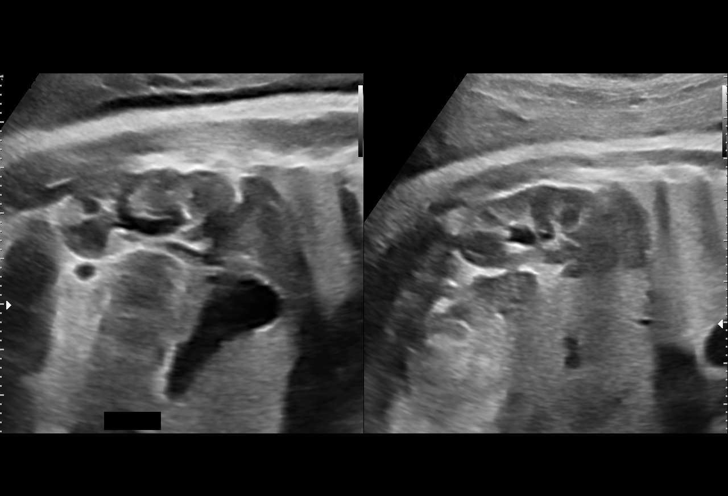
[im 13/38]
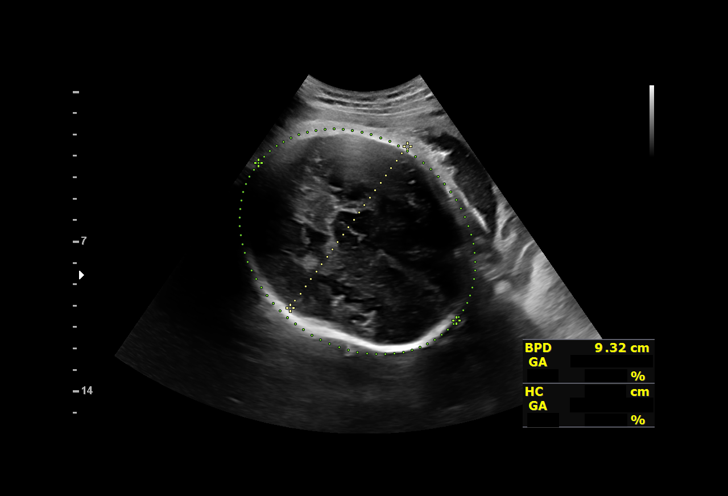
[im 16/38]
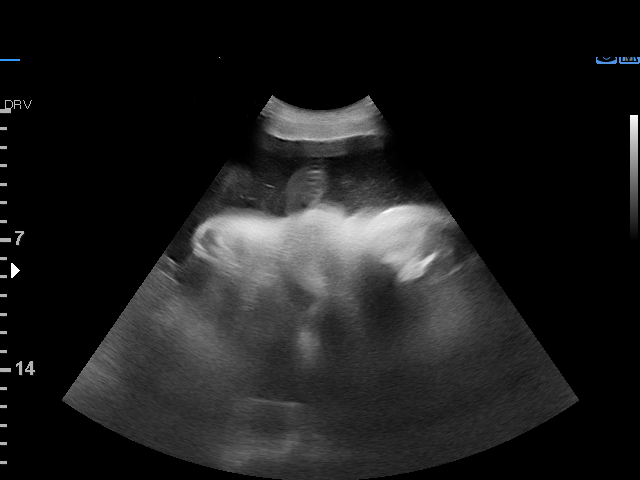
[im 20/38]
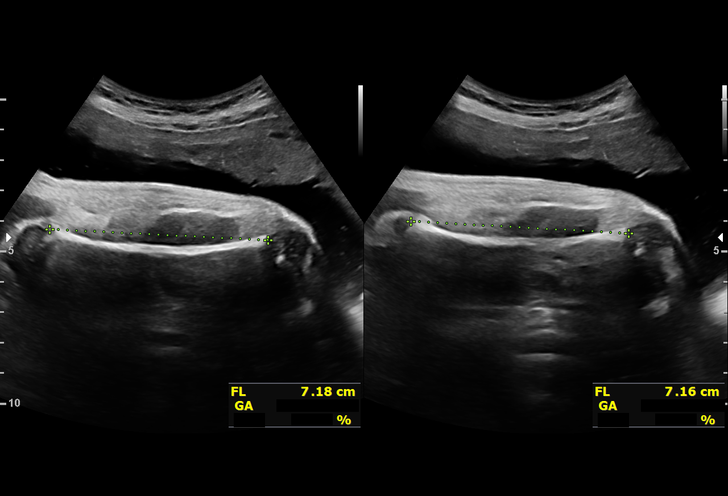
[im 22/38]
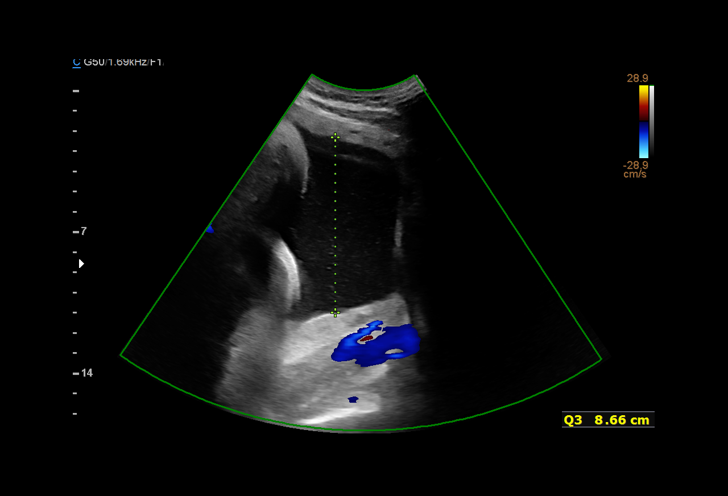
[im 25/38]
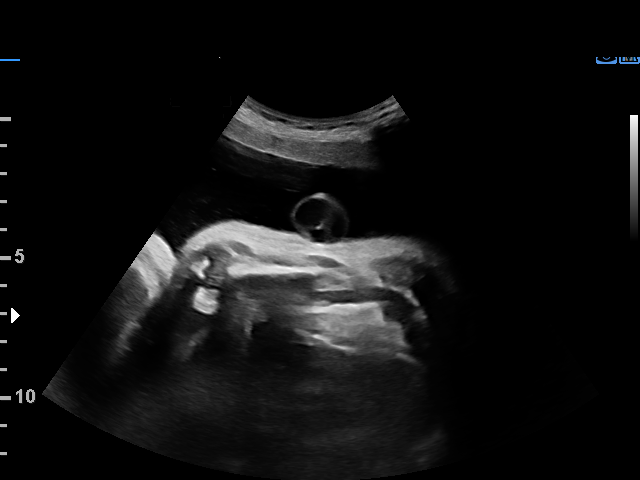
[im 28/38]
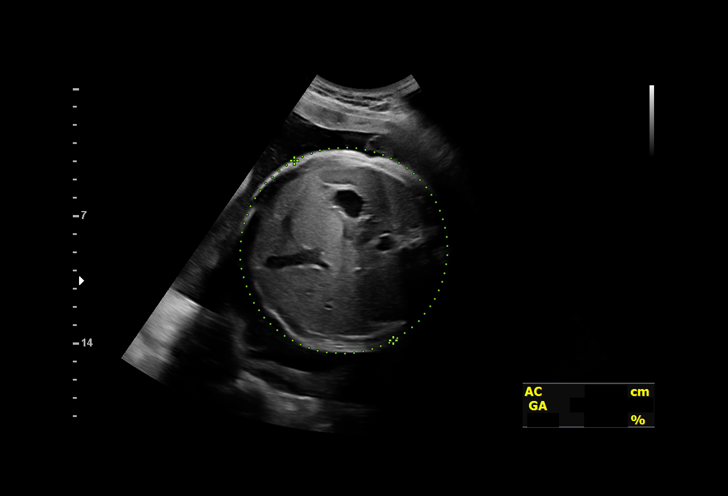
[im 31/38]
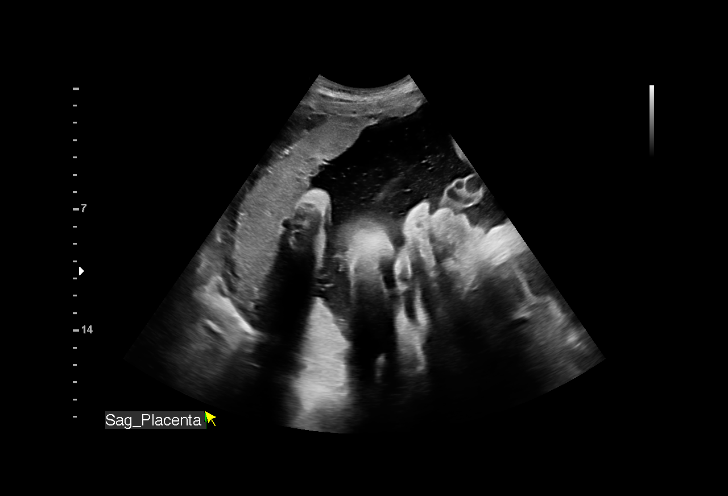
[im 33/38]
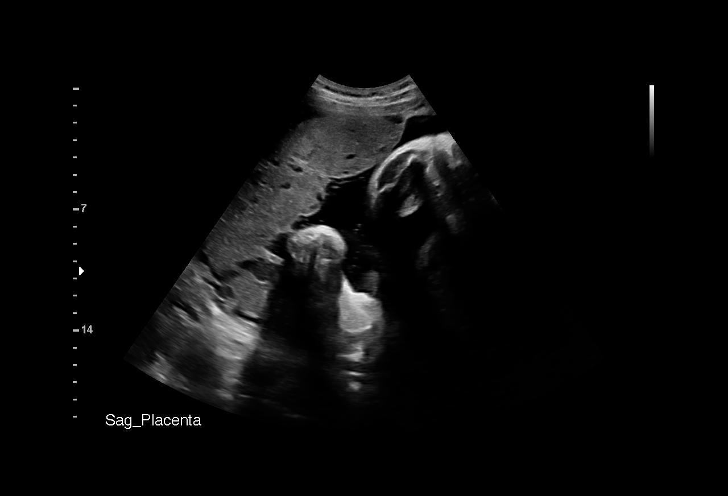
[im 36/38]
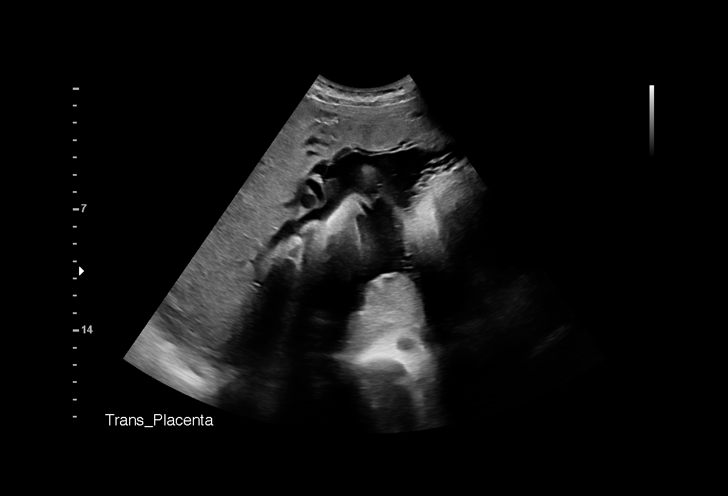

[13 of 28 positions shown; findings below may reference images not displayed]

OB/GYN

Indications

 Uterine fibroids affecting pregnancy in third  O34.13, [AG]
 trimester, antepartum
 37 weeks gestation of pregnancy
 IVF pregnancy
 Uterine size-date discrepancy, third trimester [AG]
 S>D
 Advanced maternal age multigravida 35+,        [AG]
 third trimester
 Maternal thalassemia complicating              [AG]
 pregnancy in third trimester
 [AG]; nips declined
 Encounter for other antenatal screening        [AG]
 follow-up
Fetal Evaluation

 Num Of Fetuses:         1
 Fetal Heart Rate(bpm):  166
 Cardiac Activity:       Observed
 Presentation:           Transverse, head to maternal left
 Placenta:               Fundal
 P. Cord Insertion:      Previously Visualized
 Amniotic Fluid
 AFI FV:      Within normal limits

 AFI Sum(cm)     %Tile       Largest Pocket(cm)
 21.94           86

 RUQ(cm)       RLQ(cm)       LUQ(cm)        LLQ(cm)

Biometry

 BPD:      93.1  mm     G. Age:  37w 6d         72  %    CI:        76.43   %    70 - 86
                                                         FL/HC:      21.3   %    20.9 -
 HC:      337.4  mm     G. Age:  38w 5d         49  %    HC/AC:      0.96        0.92 -
 AC:      352.3  mm     G. Age:  39w 1d         92  %    FL/BPD:     77.2   %    71 - 87
 FL:       71.9  mm     G. Age:  36w 6d         26  %    FL/AC:      20.4   %    20 - 24
 HUM:      63.5  mm     G. Age:  36w 6d         57  %

 Est. FW:    [AG]  gm    7 lb 11 oz      76  %
OB History

 Gravidity:    3         Term:   2
 Living:       2
Gestational Age

 U/S Today:     38w 1d                                        EDD:   [DATE]
 Best:          37w 6d     Det. By:  Embryo Transfer          EDD:   [DATE]
Anatomy

 Cranium:               Appears normal         LVOT:                   Not well visualized
 Cavum:                 Previously seen        Aortic Arch:            Previously seen
 Ventricles:            Previously seen        Ductal Arch:            Not well visualized
 Choroid Plexus:        Previously seen        Diaphragm:              Previously seen
 Cerebellum:            Previously seen        Stomach:                Appears normal, left
                                                                       sided
 Posterior Fossa:       Previously seen        Abdomen:                Appears normal
 Nuchal Fold:           Not applicable (>20    Abdominal Wall:         Previously seen
                        wks GA)
 Face:                  Orbits prev seen;      Cord Vessels:           Previously seen
                        profile not vis.
 Lips:                  Previously seen        Kidneys:                Appear normal
 Palate:                Not well visualized    Bladder:                Appears normal
 Thoracic:              Appears normal         Spine:                  Previously seen
 Heart:                 Previously seen        Upper Extremities:      Previously seen
 RVOT:                  Not well visualized    Lower Extremities:      Previously seen

 Other:  Fetus appears to be a male prev seen. Lenses prev visualized.
         Technically difficult due to advanced gestational age.
Cervix Uterus Adnexa

 Cervix
 Not visualized (advanced GA >[AG])
 Uterus
 Single fibroid noted, see table below.
Myomas

 Site                     L(cm)      W(cm)      D(cm)       Location
 LUS

 Blood Flow                  RI       PI       Comments

Impression

 Patient returned for fetal growth assessment.  A large uterine
 leiomyoma was seen on previous scan.
 Patient does not have symptoms of abdominal pain.
 Fetal growth is appropriate for gestational age.  Amniotic fluid
 is normal and good fetal activity seen.  Transverse lie and
 head to maternal left.
 A large intramural myoma is seen anterior to the cervix and
 measures 10.4 x 8.6 x 11.9 centimeters.
 I explained the findings.
 Patient is aware that if malpresentation continues, she will
 undergo elective cesarean delivery.  I recommend evaluation
 next week at your office and if cephalic presentation is
 confirmed, transvaginal ultrasound may be performed to
 assess the location of myoma in relation to the cervix.
 Vaginal delivery may be attempted if cephalic presentation is
 seen.  Patient was made aware that if labor does not
 progress cesarean section would be performed.
 I informed her that because of the location of myoma a
 classical cesarean section may be performed. However, the
 decision will be made only during surgery.
Recommendations

 Follow-up scans as clinically indicated.
                 BOMJAN

## 2021-01-20 ENCOUNTER — Telehealth: Payer: Self-pay | Admitting: *Deleted

## 2021-01-20 NOTE — Telephone Encounter (Signed)
Provided patient with [facility] PAT [phone]. She would like to know where to do her Covid testing for maternity admission on Saturday.

## 2021-01-21 ENCOUNTER — Other Ambulatory Visit (HOSPITAL_COMMUNITY): Payer: BC Managed Care – PPO

## 2021-01-21 ENCOUNTER — Encounter (HOSPITAL_COMMUNITY): Payer: Self-pay

## 2021-01-21 ENCOUNTER — Other Ambulatory Visit (HOSPITAL_COMMUNITY)
Admission: RE | Admit: 2021-01-21 | Discharge: 2021-01-21 | Disposition: A | Discharge status: Home / Self Care | Payer: BC Managed Care – PPO | Source: Ambulatory Visit | Attending: Obstetrics and Gynecology | Admitting: Obstetrics and Gynecology

## 2021-01-21 LAB — CBC
HCT: 37.8 % (ref 36.0–46.0)
Hemoglobin: 11.6 g/dL — ABNORMAL LOW (ref 12.0–15.0)
MCH: 21.5 pg — ABNORMAL LOW (ref 26.0–34.0)
MCHC: 30.7 g/dL (ref 30.0–36.0)
MCV: 70.1 fL — ABNORMAL LOW (ref 80.0–100.0)
Platelets: 165 10*3/uL (ref 150–400)
RBC: 5.39 MIL/uL — ABNORMAL HIGH (ref 3.87–5.11)
RDW: 17.5 % — ABNORMAL HIGH (ref 11.5–15.5)
WBC: 10.8 10*3/uL — ABNORMAL HIGH (ref 4.0–10.5)
nRBC: 0 % (ref 0.0–0.2)

## 2021-01-21 LAB — TYPE AND SCREEN
ABO/RH(D): O POS
Antibody Screen: NEGATIVE

## 2021-01-21 LAB — SARS CORONAVIRUS 2 (TAT 6-24 HRS): SARS Coronavirus 2: NEGATIVE

## 2021-01-21 NOTE — Patient Instructions (Signed)
Rachel Lawrence  01/21/2021   Your procedure is scheduled on: 01/22/21  Arrive at 0930am at Entrance C on [address] at [facility] and [address]. You are invited to use the FREE valet parking or use the Visitor's parking deck.  Pick up the phone at the desk and dial [phone].  Call this number if you have problems the morning of surgery: [phone]  Remember:   Do not eat food:(After Midnight) Desps de medianoche.  Do not drink clear liquids: (After Midnight) Desps de medianoche.  Take these medicines the morning of surgery with A SIP OF WATER:  none   Do not wear jewelry, make-up or nail polish.  Do not wear lotions, powders, or perfumes. Do not wear deodorant.  Do not shave 48 hours prior to surgery.  Do not bring valuables to the hospital.  [facility] is not   responsible for any belongings or valuables brought to the hospital.  Contacts, dentures or bridgework may not be worn into surgery.  Leave suitcase in the car. After surgery it may be brought to your room.  For patients admitted to the hospital, checkout time is 11:00 AM the day of              discharge.      Please read over the following fact sheets that you were given:     Preparing for Surgery

## 2021-01-22 ENCOUNTER — Inpatient Hospital Stay (HOSPITAL_COMMUNITY): Payer: BC Managed Care – PPO | Admitting: Anesthesiology

## 2021-01-22 ENCOUNTER — Encounter (HOSPITAL_COMMUNITY)
Admission: RE | Disposition: A | Discharge status: Home / Self Care | Payer: Self-pay | Source: Home / Self Care | Attending: Obstetrics and Gynecology

## 2021-01-22 ENCOUNTER — Encounter (HOSPITAL_COMMUNITY): Payer: Self-pay | Admitting: Obstetrics and Gynecology

## 2021-01-22 ENCOUNTER — Other Ambulatory Visit: Payer: Self-pay

## 2021-01-22 ENCOUNTER — Inpatient Hospital Stay (HOSPITAL_COMMUNITY)
Admission: RE | Admit: 2021-01-22 | Discharge: 2021-01-25 | DRG: 788 | Disposition: A | Discharge status: Home / Self Care | Payer: BC Managed Care – PPO | Attending: Obstetrics and Gynecology | Admitting: Obstetrics and Gynecology

## 2021-01-22 DIAGNOSIS — Z20822 Contact with and (suspected) exposure to covid-19: Secondary | ICD-10-CM | POA: Diagnosis present

## 2021-01-22 DIAGNOSIS — O3413 Maternal care for benign tumor of corpus uteri, third trimester: Secondary | ICD-10-CM

## 2021-01-22 DIAGNOSIS — D509 Iron deficiency anemia, unspecified: Secondary | ICD-10-CM | POA: Diagnosis present

## 2021-01-22 DIAGNOSIS — O322XX0 Maternal care for transverse and oblique lie, not applicable or unspecified: Secondary | ICD-10-CM

## 2021-01-22 DIAGNOSIS — Z3A39 39 weeks gestation of pregnancy: Secondary | ICD-10-CM

## 2021-01-22 DIAGNOSIS — D251 Intramural leiomyoma of uterus: Secondary | ICD-10-CM | POA: Diagnosis present

## 2021-01-22 DIAGNOSIS — O9902 Anemia complicating childbirth: Secondary | ICD-10-CM | POA: Diagnosis present

## 2021-01-22 LAB — RPR: RPR Ser Ql: NONREACTIVE

## 2021-01-22 SURGERY — Surgical Case
Anesthesia: Spinal | Wound class: Clean Contaminated

## 2021-01-22 MED ORDER — SCOPOLAMINE 1 MG/3DAYS TD PT72: 1.0000 | MEDICATED_PATCH | Freq: Once | TRANSDERMAL | Status: DC

## 2021-01-22 MED ORDER — KETOROLAC TROMETHAMINE 30 MG/ML IJ SOLN: 30.0000 mg | Freq: Once | INTRAMUSCULAR | Status: DC | PRN

## 2021-01-22 MED ORDER — NALOXONE HCL 0.4 MG/ML IJ SOLN: 0.4000 mg | INTRAMUSCULAR | Status: DC | PRN

## 2021-01-22 MED ORDER — PHENYLEPHRINE HCL (PRESSORS) 10 MG/ML IV SOLN: INTRAVENOUS | Status: DC | PRN

## 2021-01-22 MED ORDER — KETOROLAC TROMETHAMINE 30 MG/ML IJ SOLN: 30.0000 mg | Freq: Four times a day (QID) | INTRAMUSCULAR | Status: AC | PRN

## 2021-01-22 MED ORDER — COCONUT OIL OIL: 1.0000 "application " | TOPICAL_OIL | Status: DC | PRN

## 2021-01-22 MED ORDER — METOCLOPRAMIDE HCL 5 MG/ML IJ SOLN
INTRAMUSCULAR | Status: AC
  Filled 2021-01-22: qty 2

## 2021-01-22 MED ORDER — NALBUPHINE HCL 10 MG/ML IJ SOLN: 5.0000 mg | INTRAMUSCULAR | Status: DC | PRN

## 2021-01-22 MED ORDER — FENTANYL CITRATE (PF) 100 MCG/2ML IJ SOLN: 25.0000 ug | INTRAMUSCULAR | Status: DC | PRN

## 2021-01-22 MED ORDER — DIPHENHYDRAMINE HCL 50 MG/ML IJ SOLN: 12.5000 mg | INTRAMUSCULAR | Status: DC | PRN

## 2021-01-22 MED ORDER — SIMETHICONE 80 MG PO CHEW
80.0000 mg | CHEWABLE_TABLET | ORAL | Status: DC | PRN
Start: 2021-01-22 — End: 2021-01-25
  Administered 2021-01-23 – 2021-01-24 (×2): 80 mg via ORAL
  Filled 2021-01-22: qty 1

## 2021-01-22 MED ORDER — STERILE WATER FOR IRRIGATION IR SOLN
Status: DC | PRN
  Administered 2021-01-22: 1

## 2021-01-22 MED ORDER — KETOROLAC TROMETHAMINE 30 MG/ML IJ SOLN
INTRAMUSCULAR | Status: AC
  Filled 2021-01-22: qty 1

## 2021-01-22 MED ORDER — LACTATED RINGERS IV SOLN: INTRAVENOUS | Status: DC

## 2021-01-22 MED ORDER — MENTHOL 3 MG MT LOZG: 1.0000 | LOZENGE | OROMUCOSAL | Status: DC | PRN

## 2021-01-22 MED ORDER — BUPIVACAINE IN DEXTROSE 0.75-8.25 % IT SOLN
INTRATHECAL | Status: DC | PRN
  Administered 2021-01-22: 1.8 mL via INTRATHECAL

## 2021-01-22 MED ORDER — OXYTOCIN-SODIUM CHLORIDE 30-0.9 UT/500ML-% IV SOLN: 2.5000 [IU]/h | INTRAVENOUS | Status: AC

## 2021-01-22 MED ORDER — SIMETHICONE 80 MG PO CHEW
80.0000 mg | CHEWABLE_TABLET | Freq: Three times a day (TID) | ORAL | Status: DC
  Administered 2021-01-23 – 2021-01-25 (×5): 80 mg via ORAL
  Filled 2021-01-22 (×7): qty 1

## 2021-01-22 MED ORDER — DIPHENHYDRAMINE HCL 25 MG PO CAPS: 25.0000 mg | ORAL_CAPSULE | ORAL | Status: DC | PRN

## 2021-01-22 MED ORDER — KETOROLAC TROMETHAMINE 30 MG/ML IJ SOLN
30.0000 mg | Freq: Four times a day (QID) | INTRAMUSCULAR | Status: AC
  Administered 2021-01-22 – 2021-01-23 (×3): 30 mg via INTRAVENOUS
  Filled 2021-01-22 (×3): qty 1

## 2021-01-22 MED ORDER — ONDANSETRON HCL 4 MG/2ML IJ SOLN: 4.0000 mg | Freq: Three times a day (TID) | INTRAMUSCULAR | Status: DC | PRN

## 2021-01-22 MED ORDER — NALBUPHINE HCL 10 MG/ML IJ SOLN: 5.0000 mg | Freq: Once | INTRAMUSCULAR | Status: DC | PRN

## 2021-01-22 MED ORDER — DEXAMETHASONE SODIUM PHOSPHATE 4 MG/ML IJ SOLN
INTRAMUSCULAR | Status: DC | PRN
  Administered 2021-01-22: 8 mg via INTRAVENOUS

## 2021-01-22 MED ORDER — POLYSACCHARIDE IRON COMPLEX 150 MG PO CAPS
150.0000 mg | ORAL_CAPSULE | Freq: Every day | ORAL | Status: DC
  Administered 2021-01-23 – 2021-01-25 (×3): 150 mg via ORAL
  Filled 2021-01-22 (×3): qty 1

## 2021-01-22 MED ORDER — ACETAMINOPHEN 10 MG/ML IV SOLN: 1000.0000 mg | Freq: Once | INTRAVENOUS | Status: DC | PRN

## 2021-01-22 MED ORDER — CEFAZOLIN SODIUM-DEXTROSE 2-3 GM-%(50ML) IV SOLR
INTRAVENOUS | Status: DC | PRN
  Administered 2021-01-22: 2 g via INTRAVENOUS

## 2021-01-22 MED ORDER — TRANEXAMIC ACID-NACL 1000-0.7 MG/100ML-% IV SOLN
INTRAVENOUS | Status: DC | PRN
  Administered 2021-01-22: 1000 mg via INTRAVENOUS

## 2021-01-22 MED ORDER — POVIDONE-IODINE 10 % EX SWAB: 2.0000 "application " | Freq: Once | CUTANEOUS | Status: DC

## 2021-01-22 MED ORDER — PHENYLEPHRINE HCL-NACL 20-0.9 MG/250ML-% IV SOLN
INTRAVENOUS | Status: AC
  Filled 2021-01-22: qty 250

## 2021-01-22 MED ORDER — METHYLERGONOVINE MALEATE 0.2 MG PO TABS
0.2000 mg | ORAL_TABLET | ORAL | Status: DC | PRN
Start: 2021-01-22 — End: 2021-01-25

## 2021-01-22 MED ORDER — PRENATAL MULTIVITAMIN CH
1.0000 | ORAL_TABLET | Freq: Every day | ORAL | Status: DC
  Administered 2021-01-23 – 2021-01-25 (×3): 1 via ORAL
  Filled 2021-01-22 (×3): qty 1

## 2021-01-22 MED ORDER — SODIUM CHLORIDE 0.9% FLUSH: 3.0000 mL | INTRAVENOUS | Status: DC | PRN

## 2021-01-22 MED ORDER — SODIUM CHLORIDE 0.9 % IR SOLN
Status: DC | PRN
  Administered 2021-01-22: 1

## 2021-01-22 MED ORDER — CEFAZOLIN SODIUM-DEXTROSE 2-4 GM/100ML-% IV SOLN: 2.0000 g | INTRAVENOUS | Status: DC

## 2021-01-22 MED ORDER — MORPHINE SULFATE (PF) 0.5 MG/ML IJ SOLN
INTRAMUSCULAR | Status: DC | PRN
  Administered 2021-01-22: .15 mg via INTRATHECAL

## 2021-01-22 MED ORDER — METHYLERGONOVINE MALEATE 0.2 MG/ML IJ SOLN
0.2000 mg | INTRAMUSCULAR | Status: DC | PRN
Start: 2021-01-22 — End: 2021-01-25

## 2021-01-22 MED ORDER — OXYCODONE HCL 5 MG PO TABS: 5.0000 mg | ORAL_TABLET | ORAL | Status: DC | PRN

## 2021-01-22 MED ORDER — ONDANSETRON HCL 4 MG/2ML IJ SOLN
INTRAMUSCULAR | Status: AC
  Filled 2021-01-22: qty 2

## 2021-01-22 MED ORDER — MORPHINE SULFATE (PF) 0.5 MG/ML IJ SOLN
INTRAMUSCULAR | Status: AC
  Filled 2021-01-22: qty 10

## 2021-01-22 MED ORDER — DIBUCAINE (PERIANAL) 1 % EX OINT: 1.0000 "application " | TOPICAL_OINTMENT | CUTANEOUS | Status: DC | PRN

## 2021-01-22 MED ORDER — KETOROLAC TROMETHAMINE 30 MG/ML IJ SOLN
30.0000 mg | Freq: Four times a day (QID) | INTRAMUSCULAR | Status: AC | PRN
  Administered 2021-01-22: 30 mg via INTRAVENOUS

## 2021-01-22 MED ORDER — DEXAMETHASONE SODIUM PHOSPHATE 4 MG/ML IJ SOLN
INTRAMUSCULAR | Status: AC
  Filled 2021-01-22: qty 2

## 2021-01-22 MED ORDER — ONDANSETRON HCL 4 MG/2ML IJ SOLN
INTRAMUSCULAR | Status: DC | PRN
  Administered 2021-01-22: 4 mg via INTRAVENOUS

## 2021-01-22 MED ORDER — NALOXONE HCL 4 MG/10ML IJ SOLN
1.0000 ug/kg/h | INTRAVENOUS | Status: DC | PRN
  Filled 2021-01-22: qty 5

## 2021-01-22 MED ORDER — VASOPRESSIN 20 UNIT/ML IV SOLN
INTRAVENOUS | Status: AC
  Filled 2021-01-22: qty 3

## 2021-01-22 MED ORDER — SOD CITRATE-CITRIC ACID 500-334 MG/5ML PO SOLN
ORAL | Status: AC
  Filled 2021-01-22: qty 30

## 2021-01-22 MED ORDER — PHENYLEPHRINE HCL-NACL 20-0.9 MG/250ML-% IV SOLN
INTRAVENOUS | Status: DC | PRN
  Administered 2021-01-22: 60 ug/min via INTRAVENOUS

## 2021-01-22 MED ORDER — ACETAMINOPHEN 500 MG PO TABS
1000.0000 mg | ORAL_TABLET | Freq: Four times a day (QID) | ORAL | Status: AC
  Administered 2021-01-22 – 2021-01-23 (×4): 1000 mg via ORAL
  Filled 2021-01-22 (×4): qty 2

## 2021-01-22 MED ORDER — OXYTOCIN-SODIUM CHLORIDE 30-0.9 UT/500ML-% IV SOLN
INTRAVENOUS | Status: DC | PRN
  Administered 2021-01-22: 300 mL via INTRAVENOUS

## 2021-01-22 MED ORDER — ZOLPIDEM TARTRATE 5 MG PO TABS: 5.0000 mg | ORAL_TABLET | Freq: Every evening | ORAL | Status: DC | PRN

## 2021-01-22 MED ORDER — BUPIVACAINE HCL (PF) 0.25 % IJ SOLN
INTRAMUSCULAR | Status: DC | PRN
  Administered 2021-01-22: 30 mL

## 2021-01-22 MED ORDER — IBUPROFEN 600 MG PO TABS
600.0000 mg | ORAL_TABLET | Freq: Four times a day (QID) | ORAL | Status: DC
  Administered 2021-01-23 – 2021-01-25 (×8): 600 mg via ORAL
  Filled 2021-01-22 (×8): qty 1

## 2021-01-22 MED ORDER — FENTANYL CITRATE (PF) 100 MCG/2ML IJ SOLN
INTRAMUSCULAR | Status: DC | PRN
  Administered 2021-01-22: 15 ug via INTRATHECAL

## 2021-01-22 MED ORDER — FENTANYL CITRATE (PF) 100 MCG/2ML IJ SOLN
INTRAMUSCULAR | Status: AC
  Filled 2021-01-22: qty 2

## 2021-01-22 MED ORDER — WITCH HAZEL-GLYCERIN EX PADS: 1.0000 "application " | MEDICATED_PAD | CUTANEOUS | Status: DC | PRN

## 2021-01-22 MED ORDER — SENNOSIDES-DOCUSATE SODIUM 8.6-50 MG PO TABS
2.0000 | ORAL_TABLET | ORAL | Status: DC
  Administered 2021-01-23 – 2021-01-25 (×3): 2 via ORAL
  Filled 2021-01-22 (×3): qty 2

## 2021-01-22 MED ORDER — PHENYLEPHRINE 40 MCG/ML (10ML) SYRINGE FOR IV PUSH (FOR BLOOD PRESSURE SUPPORT)
PREFILLED_SYRINGE | INTRAVENOUS | Status: AC
  Filled 2021-01-22: qty 10

## 2021-01-22 MED ORDER — LACTATED RINGERS IV SOLN: INTRAVENOUS | Status: DC | PRN

## 2021-01-22 MED ORDER — SOD CITRATE-CITRIC ACID 500-334 MG/5ML PO SOLN
30.0000 mL | ORAL | Status: AC
  Administered 2021-01-22: 30 mL via ORAL

## 2021-01-22 MED ORDER — BUPIVACAINE HCL (PF) 0.25 % IJ SOLN
INTRAMUSCULAR | Status: AC
  Filled 2021-01-22: qty 30

## 2021-01-22 MED ORDER — DIPHENHYDRAMINE HCL 25 MG PO CAPS: 25.0000 mg | ORAL_CAPSULE | Freq: Four times a day (QID) | ORAL | Status: DC | PRN

## 2021-01-22 SURGICAL SUPPLY — 47 items
APL SKNCLS STERI-STRIP NONHPOA (GAUZE/BANDAGES/DRESSINGS) ×1
BARRIER ADHS 3X4 INTERCEED (GAUZE/BANDAGES/DRESSINGS) ×2 IMPLANT
BENZOIN TINCTURE PRP APPL 2/3 (GAUZE/BANDAGES/DRESSINGS) ×1 IMPLANT
BRR ADH 4X3 ABS CNTRL BYND (GAUZE/BANDAGES/DRESSINGS) ×1
CHLORAPREP W/TINT 26ML (MISCELLANEOUS) ×2 IMPLANT
CLAMP CORD UMBIL (MISCELLANEOUS) IMPLANT
CLOTH BEACON ORANGE TIMEOUT ST (SAFETY) ×2 IMPLANT
DRAPE C SECTION CLR SCREEN (DRAPES) ×2 IMPLANT
DRSG OPSITE POSTOP 4X10 (GAUZE/BANDAGES/DRESSINGS) ×2 IMPLANT
ELECT REM PT RETURN 9FT ADLT (ELECTROSURGICAL) ×2
ELECTRODE REM PT RTRN 9FT ADLT (ELECTROSURGICAL) ×1 IMPLANT
EXTRACTOR VACUUM M CUP 4 TUBE (SUCTIONS) IMPLANT
GLOVE BIOGEL PI IND STRL 7.0 (GLOVE) ×2 IMPLANT
GLOVE BIOGEL PI INDICATOR 7.0 (GLOVE) ×2
GLOVE ECLIPSE 6.5 STRL STRAW (GLOVE) ×2 IMPLANT
GOWN STRL REUS W/TWL LRG LVL3 (GOWN DISPOSABLE) ×4 IMPLANT
KIT ABG SYR 3ML LUER SLIP (SYRINGE) IMPLANT
NDL HYPO 25X5/8 SAFETYGLIDE (NEEDLE) IMPLANT
NEEDLE HYPO 22GX1.5 SAFETY (NEEDLE) ×2 IMPLANT
NEEDLE HYPO 25X5/8 SAFETYGLIDE (NEEDLE) IMPLANT
NS IRRIG 1000ML POUR BTL (IV SOLUTION) ×2 IMPLANT
PACK C SECTION WH (CUSTOM PROCEDURE TRAY) ×2 IMPLANT
PAD OB MATERNITY 4.3X12.25 (PERSONAL CARE ITEMS) ×2 IMPLANT
RTRCTR C-SECT PINK 25CM LRG (MISCELLANEOUS) IMPLANT
STRIP CLOSURE SKIN 1/2X4 (GAUZE/BANDAGES/DRESSINGS) IMPLANT
STRIP CLOSURE SKIN 1/4X4 (GAUZE/BANDAGES/DRESSINGS) ×1 IMPLANT
SUT CHROMIC GUT AB #0 18 (SUTURE) IMPLANT
SUT MNCRL 0 VIOLET CTX 36 (SUTURE) ×3 IMPLANT
SUT MON AB 2-0 CT1 27 (SUTURE) ×1 IMPLANT
SUT MON AB 2-0 SH 27 (SUTURE)
SUT MON AB 2-0 SH27 (SUTURE) IMPLANT
SUT MON AB 3-0 SH 27 (SUTURE)
SUT MON AB 3-0 SH27 (SUTURE) IMPLANT
SUT MON AB 4-0 PS1 27 (SUTURE) IMPLANT
SUT MON AB-0 CT1 36 (SUTURE) ×1 IMPLANT
SUT MONOCRYL 0 CTX 36 (SUTURE) ×8
SUT PLAIN 2 0 (SUTURE)
SUT PLAIN 2 0 XLH (SUTURE) ×1 IMPLANT
SUT PLAIN ABS 2-0 CT1 27XMFL (SUTURE) IMPLANT
SUT VIC AB 0 CT1 36 (SUTURE) ×4 IMPLANT
SUT VIC AB 2-0 CT1 27 (SUTURE) ×2
SUT VIC AB 2-0 CT1 TAPERPNT 27 (SUTURE) ×1 IMPLANT
SUT VIC AB 4-0 PS2 27 (SUTURE) IMPLANT
SYR CONTROL 10ML LL (SYRINGE) ×2 IMPLANT
TOWEL OR 17X24 6PK STRL BLUE (TOWEL DISPOSABLE) ×2 IMPLANT
TRAY FOLEY W/BAG SLVR 14FR LF (SET/KITS/TRAYS/PACK) IMPLANT
WATER STERILE IRR 1000ML POUR (IV SOLUTION) ×2 IMPLANT

## 2021-01-22 NOTE — Transfer of Care (Signed)
Immediate Anesthesia Transfer of Care Note  Patient: Rachel Lawrence  Procedure(s) Performed: CESAREAN SECTION (N/A ) APPLICATION OF CELL SAVER (N/A )  Patient Location: PACU  Anesthesia Type:Spinal and Epidural  Level of Consciousness: awake, alert  and oriented  Airway & Oxygen Therapy: Patient Spontanous Breathing  Post-op Assessment: Report given to RN and Post -op Vital signs reviewed and stable  Post vital signs: Reviewed and stable  Last Vitals:  Vitals Value Taken Time  BP    Temp    Pulse    Resp    SpO2      Last Pain:  Vitals:   01/22/21 1014  TempSrc: Oral  PainSc: 0-No pain         Complications: No complications documented.

## 2021-01-22 NOTE — Brief Op Note (Signed)
01/22/2021  1:54 PM  PATIENT:  Rachel Lawrence  45 y.o. female  PRE-OPERATIVE DIAGNOSIS: Malpresentation, IUP @ 39 3/7 wk, uterine fibroids affecting prenancy, AMA, IVF pregnancy  POST-OPERATIVE DIAGNOSIS:  Left  Broad ligament LUS fibroid, Fibroid uterus, transverse presentation, IUP @ 39 3/7 wk, AMA, IVF pregnancy  PROCEDURE:  Primary Cesarean section, Classical hysterotomy  SURGEON:  Surgeon(s) and Role:    Servando Salina, MD - Primary      PHYSICIAN ASSISTANT:   ASSISTANTS: Peggy Constant MD   ANESTHESIA:   epidural and spinal( combined) Findings: live female right transverse, nl tube and ovaries Left LUS 12 cm broad ligament fibroid, multiple other IM fibroids EBL:  369 ml    BLOOD ADMINISTERED:none  DRAINS: none   LOCAL MEDICATIONS USED:  MARCAINE     SPECIMEN:  No Specimen  DISPOSITION OF SPECIMEN:  N/A  COUNTS:  YES  TOURNIQUET:  * No tourniquets in log *  DICTATION: .Other Dictation: Dictation Number [id number]  PLAN OF CARE: Admit to inpatient   PATIENT DISPOSITION:  PACU - hemodynamically stable.   Delay start of Pharmacological VTE agent (>24hrs) due to surgical blood loss or risk of bleeding: no

## 2021-01-22 NOTE — Interval H&P Note (Signed)
History and Physical Interval Note:  01/22/2021 11:18 AM  Rachel Lawrence  has presented today for surgery, with the diagnosis of possible myomectomy/ possible cesarean hysterectomy - malpresentation, fibroids.  The various methods of treatment have been discussed with the patient and family. After consideration of risks, benefits and other options for treatment, the patient has consented to  PROCEDURES:  [place] as a surgical intervention.  The patient's history has been reviewed, patient examined, no change in status, stable for surgery.  I have reviewed the patient's chart and labs.  Questions were answered to the patient's satisfaction.     Devanny Palecek A Dayleen Beske

## 2021-01-22 NOTE — Anesthesia Preprocedure Evaluation (Signed)
Anesthesia Evaluation  Patient identified by MRN, date of birth, ID band Patient awake    Reviewed: Allergy & Precautions, NPO status , Patient's Chart, lab work & pertinent test results  Airway Mallampati: II  TM Distance: >3 FB Neck ROM: Full    Dental no notable dental hx.    Pulmonary asthma ,    Pulmonary exam normal breath sounds clear to auscultation       Cardiovascular negative cardio ROS Normal cardiovascular exam Rhythm:Regular Rate:Normal     Neuro/Psych negative neurological ROS  negative psych ROS   GI/Hepatic negative GI ROS, Neg liver ROS,   Endo/Other  negative endocrine ROS  Renal/GU negative Renal ROS  negative genitourinary   Musculoskeletal negative musculoskeletal ROS (+)   Abdominal   Peds  Hematology negative hematology ROS (+)   Anesthesia Other Findings Primary C/S for transverse lie, possible hysterectomy 2/2 large fibroid  Reproductive/Obstetrics (+) Pregnancy                             Anesthesia Physical Anesthesia Plan  ASA: III  Anesthesia Plan: Spinal   Post-op Pain Management:    Induction:   PONV Risk Score and Plan: 2 and Treatment may vary due to age or medical condition  Airway Management Planned: Natural Airway  Additional Equipment:   Intra-op Plan:   Post-operative Plan:   Informed Consent: I have reviewed the patients History and Physical, chart, labs and discussed the procedure including the risks, benefits and alternatives for the proposed anesthesia with the patient or authorized representative who has indicated his/her understanding and acceptance.     Dental advisory given  Plan Discussed with: CRNA  Anesthesia Plan Comments:         Anesthesia Quick Evaluation

## 2021-01-22 NOTE — H&P (Signed)
Rachel Lawrence is a 45 y.o. female presenting @ 13 4/[redacted] wk gestation for primary C/S due to malpresentation( transverse lie) with large anterior( 11 cm fibroid). Pt aware of possible need for myomectomy, possible Cesarean hysterectomy due to the large fibroid. PNC notable for IVF  Pregnancy, AMA.  OB History    Gravida  3   Para  2   Term  2   Preterm      AB      Living  2     SAB      IAB      Ectopic      Multiple      Live Births  2          Past Medical History:  Diagnosis Date  . Asthma   . Fibroid    Past Surgical History:  Procedure Laterality Date  . plantar foot surg    . WISDOM TOOTH EXTRACTION  2005   Family History: family history includes Cancer in her maternal aunt and mother; Diabetes in her maternal aunt; Hypertension in her mother. Social History:  reports that she has never smoked. She has never used smokeless tobacco. She reports that she does not drink alcohol and does not use drugs.     Maternal Diabetes: No Genetic Screening: Declined Maternal Ultrasounds/Referrals: Normal Fetal Ultrasounds or other Referrals:  Fetal echo, Referred to Materal Fetal Medicine  Maternal Substance Abuse:  No Significant Maternal Medications:  Meds include: Other:  Significant Maternal Lab Results:  Group B Strep negative Other Comments:  IVF pregnancy, AMA alpha thal trait FOB neg  Review of Systems  All other systems reviewed and are negative.  History   There were no vitals taken for this visit. Maternal Exam:  Introitus: Normal vulva.   Physical Exam Constitutional:      Appearance: Normal appearance.  HENT:     Head: Atraumatic.  Eyes:     Extraocular Movements: Extraocular movements intact.  Cardiovascular:     Rate and Rhythm: Regular rhythm.  Pulmonary:     Breath sounds: Normal breath sounds.  Genitourinary:    General: Normal vulva.     Comments: Cervix FT/60-70/ present part( fibroid) Gravid uterus Musculoskeletal:         General: Swelling present.     Cervical back: Neck supple.  Skin:    General: Skin is warm and dry.  Neurological:     General: No focal deficit present.     Mental Status: She is alert and oriented to person, place, and time.  Psychiatric:        Mood and Affect: Mood normal.        Behavior: Behavior normal.     Prenatal labs: ABO, Rh: --/--/O POS (05/27 0263) Antibody: NEG (05/27 0922) Rubella:  Immune RPR:   NR HBsAg:   neg HIV:   neg GBS:   negative  Assessment/Plan: Malpresentation Uterine fibroid affecting pregnancy Term gestation  IVF pregnancy AMA P) Primary C/S. Poss myomectomy, possible Cesarean hysterectomy. Routine labs. T&S, Cell saver. Combined spinal/epidural anesthesia. Procedures explained. Risk of procedure explained including infection, bleeding, possible need for blood transfusion and its risk( HIV, acute rxn, hepatitis, hiv), injury to bladder, bowel, ureter, poss need for removal of fibroid including to facilitate uterine closure, delivery of baby, possible hysterectomy due to uncontrollable bleeding which would result in loss of menses and ability to have any more children, thermal injury. Internal scar tissue. All ? answered  Bess Saltzman A Travarius Lange 01/22/2021, 10:16  AM    

## 2021-01-22 NOTE — Op Note (Signed)
NAME: CALISTA, CRAIN MEDICAL RECORD NO: [id number] ACCOUNT NO: [id number] DATE OF BIRTH: 06-Feb-1976 FACILITY: [facility] LOCATION: [facility] PHYSICIAN: Haniyah Maciolek A. Garwin Brothers, MD  Operative Report   DATE OF PROCEDURE: 01/22/2021  PREOPERATIVE DIAGNOSES:  Malpresentation, intrauterine gestation at 41 and 3/7 weeks' gestation, fibroid uterus affecting pregnancy, in vitro fertilization pregnancy, advanced maternal age.  PROCEDURE:  Primary cesarean section, classical hysterotomy.  POSTOPERATIVE DIAGNOSES:  Large left broad ligament lower uterine segment fibroid, fibroid uterus, transverse presentation, intrauterine gestation at 35 and 3/7 weeks, advanced maternal age, in vitro fertilization pregnancy.  ANESTHESIA:  Combined spinal epidural anesthesia.  SURGEON:  Bryen Hinderman A. Garwin Brothers, MD  ASSISTANT:  Mora Bellman, MD.  DESCRIPTION OF PROCEDURE:  Under adequate combined spinal epidural anesthesia, the patient was placed in the supine position with a left lateral tilt.  She was sterilely prepped and draped in the usual fashion.  Indwelling Foley catheter was sterilely  placed.  0.25% Marcaine was injected along the planned Pfannenstiel skin incision site.  Pfannenstiel skin incision was then made, carried down to the rectus fascia.  The rectus fascia was opened transversely.  Rectus fascia was then bluntly and sharply  dissected off the rectus muscle in a superior and inferior fashion.  The rectus muscle was split in the midline.  The parietal peritoneum was entered sharply and extended.  Exploration of the abdomen revealed a large( 12 cm) left lower uterine segment broad  ligament fibroid extending down to the cervical level.  Multiple other fibroids were noted including in the midline.  Based on the findings, decision was made to perform a classical uterine incision.  Alexis retractor was then placed.  Vertical incision  was then made, carried down carefully and extended using bandage scissors. With  intact membranes, attempt to find a foot was done, and in that process, the copious incidental rupture of membranes occurred.  Initial delivery  of the left arm was done, which was then decompressed back into the abdomen, and subsequently, the left foot was identified followed by the right foot with subsequent bringing of the baby down and delivery of the head of a live female infant in the usual breech maneuver fashion. The baby was transferred to the awaiting pediatricians who assigned Apgars of 9 and 10 at one and five minutes respectively.   The placenta which was posterior, was manually removed.  Uterine cavity was cleaned of debris.  The uterine incision had in the lower segment an intramural fibroid, which was just abutting the incision, but the incision was able to be closed in multiple layers using 0 Monocryl with a running locked stitch in the first two layers interrupted figure-of-eight sutures.  By the third layer until the serosal surface was reached, at which time 2-0 Monocryl in a baseball stitch was placed closing the skin.  Normal  tubes and ovaries were noted bilaterally.  Abdomen was irrigated, suctioned of debris.  Interceed was placed overlying that incision.  The Alexis retractor was removed.  The parietal peritoneum was then closed with 2-0 Vicryl.  The rectus fascia was  closed with 0 Vicryl x2.  The subcutaneous area was irrigated.  Small bleeders cauterized.  Interrupted 2-0 plain sutures placed and the skin approximated using 4-0 Monocryl suture.  Steri-Strips and benzoin were placed.  SPECIMENS:  Placenta, not sent to pathology.  ESTIMATED BLOOD LOSS:  369 mL.  INTRAOPERATIVE FLUIDS:  2300 mL.  TXA:  1000 mg IV.  URINE OUTPUT:  500 mL clear yellow urine.  COUNTS:  Sponge  and instrument counts x2 were correct.  COMPLICATIONS:  None.  The patient tolerated the procedure well and was transferred to recovery room in stable condition.   ROH D: 01/22/2021 1:54:13 pm T:  01/22/2021 4:43:00 pm  JOB: [id number]/ [id number]

## 2021-01-22 NOTE — Anesthesia Procedure Notes (Signed)
Epidural Patient location during procedure: OR Start time: 01/22/2021 11:50 AM End time: 01/22/2021 12:00 PM  Staffing Anesthesiologist: Freddrick March, MD Performed: anesthesiologist   Preanesthetic Checklist Completed: patient identified, IV checked, risks and benefits discussed, monitors and equipment checked, pre-op evaluation and timeout performed  Epidural Patient position: sitting Prep: DuraPrep and site prepped and draped Patient monitoring: continuous pulse ox, blood pressure, heart rate and cardiac monitor Approach: midline Location: L3-L4 Injection technique: LOR air  Needle:  Needle type: Tuohy  Needle gauge: 17 G Needle length: 9 cm Needle insertion depth: 6 cm Catheter type: closed end flexible Catheter size: 19 Gauge Catheter at skin depth: 11 cm Test dose: negative  Assessment Sensory level: T8 Events: blood not aspirated, injection not painful, no injection resistance, no paresthesia and negative IV test  Additional Notes Patient identified. Risks/Benefits/Options discussed with patient including but not limited to bleeding, infection, nerve damage, paralysis, failed block, incomplete pain control, headache, blood pressure changes, nausea, vomiting, reactions to medication both or allergic, itching and postpartum back pain. Confirmed with bedside nurse the patient's most recent platelet count. Confirmed with patient that they are not currently taking any anticoagulation, have any bleeding history or any family history of bleeding disorders. Patient expressed understanding and wished to proceed. All questions were answered. Sterile technique was used throughout the entire procedure. Please see nursing notes for vital signs. Test dose was given through epidural catheter and negative prior to continuing to dose epidural or start infusion. Warning signs of high block given to the patient including shortness of breath, tingling/numbness in hands, complete motor block,  or any concerning symptoms with instructions to call for help. Patient was given instructions on fall risk and not to get out of bed. All questions and concerns addressed with instructions to call with any issues or inadequate analgesia.    CSE performed for pt history of large fibroid and possible hysterectomy. Tuohy advanced into epidural space. LOR at 6cm. 25g Pencan advanced into intrathecal space. CSF present. Intrathecal medications administered as per chart. Pencan removed and epidural catheter inserted into epidural space. Left at 11cm at skin. No complications. Pt tolerated procedure well. VSS. Reason for block:procedure for pain

## 2021-01-23 LAB — CBC
HCT: 28.8 % — ABNORMAL LOW (ref 36.0–46.0)
Hemoglobin: 9.1 g/dL — ABNORMAL LOW (ref 12.0–15.0)
MCH: 22.1 pg — ABNORMAL LOW (ref 26.0–34.0)
MCHC: 31.6 g/dL (ref 30.0–36.0)
MCV: 70.1 fL — ABNORMAL LOW (ref 80.0–100.0)
Platelets: 159 10*3/uL (ref 150–400)
RBC: 4.11 MIL/uL (ref 3.87–5.11)
RDW: 17 % — ABNORMAL HIGH (ref 11.5–15.5)
WBC: 22.3 10*3/uL — ABNORMAL HIGH (ref 4.0–10.5)
nRBC: 0 % (ref 0.0–0.2)

## 2021-01-23 NOTE — Progress Notes (Signed)
SVD: primary  S:  Pt reports feeling well (+) soreness/ Tolerating po/ Voiding without problems/ No n/v/ Bleeding is moderate/ Pain controlled withprescription NSAID's including ibuprofen (Motrin) and narcotic analgesics including oxycodone (Oxycontin, Oxyir)    O:  A & O x 3  VS: Blood pressure 103/69, pulse 75, temperature 98.3 F (36.8 C), temperature source Oral, resp. rate 18, height 5\' 1"  (1.549 m), weight 70.3 kg, SpO2 98 %, unknown if currently breastfeeding.  LABS:  Results for orders placed or performed during the hospital encounter of 01/22/21 (from the past 24 hour(s))  CBC     Status: Abnormal   Collection Time: 01/23/21  4:30 AM  Result Value Ref Range   WBC 22.3 (H) 4.0 - 10.5 K/uL   RBC 4.11 3.87 - 5.11 MIL/uL   Hemoglobin 9.1 (L) 12.0 - 15.0 g/dL   HCT 28.8 (L) 36.0 - 46.0 %   MCV 70.1 (L) 80.0 - 100.0 fL   MCH 22.1 (L) 26.0 - 34.0 pg   MCHC 31.6 30.0 - 36.0 g/dL   RDW 17.0 (H) 11.5 - 15.5 %   Platelets 159 150 - 400 K/uL   nRBC 0.0 0.0 - 0.2 %    I&O: I/O last 3 completed shifts: In: 9163 [P.O.:840; I.V.:2500; Blood:200] Out: 2344 [Urine:1975; Blood:369]   No intake/output data recorded.  Lungs: chest clear, no wheezing, rales, normal symmetric air entry  Heart: regular rate and rhythm, S1, S2 normal, no murmur, click, rub or gallop  Abdomen: uterus * fibroid 4 FB above. firm  Perineum: not inspected  Lochia: moderate  Extremities:no redness or tenderness in the calves or thighs, edema 1+    A/P: POD # 1/PPD # 1/ G3P3003 s/p C/S with large Fibroid uterus Anemia   Doing well  Continue routine post partum orders

## 2021-01-23 NOTE — Anesthesia Postprocedure Evaluation (Signed)
Anesthesia Post Note  Patient: Rachel Lawrence  Procedure(s) Performed: CESAREAN SECTION (N/A ) APPLICATION OF CELL SAVER (N/A )     Patient location during evaluation: PACU Anesthesia Type: Spinal Level of consciousness: oriented and awake and alert Pain management: pain level controlled Vital Signs Assessment: post-procedure vital signs reviewed and stable Respiratory status: spontaneous breathing, respiratory function stable and patient connected to nasal cannula oxygen Cardiovascular status: blood pressure returned to baseline and stable Postop Assessment: no headache, no backache and no apparent nausea or vomiting Anesthetic complications: no   No complications documented.  Last Vitals:  Vitals:   01/23/21 0310 01/23/21 0609  BP: 102/70 103/69  Pulse: 75 75  Resp: 18 18  Temp: 37.1 C 36.8 C  SpO2: 96% 98%    Last Pain:  Vitals:   01/23/21 0609  TempSrc: Oral  PainSc: 0-No pain   Pain Goal:                   Auriah Hollings L Carron Jaggi

## 2021-01-24 LAB — CBC
HCT: 30.4 % — ABNORMAL LOW (ref 36.0–46.0)
Hemoglobin: 9.3 g/dL — ABNORMAL LOW (ref 12.0–15.0)
MCH: 21.6 pg — ABNORMAL LOW (ref 26.0–34.0)
MCHC: 30.6 g/dL (ref 30.0–36.0)
MCV: 70.5 fL — ABNORMAL LOW (ref 80.0–100.0)
Platelets: 190 10*3/uL (ref 150–400)
RBC: 4.31 MIL/uL (ref 3.87–5.11)
RDW: 17.2 % — ABNORMAL HIGH (ref 11.5–15.5)
WBC: 14.5 10*3/uL — ABNORMAL HIGH (ref 4.0–10.5)
nRBC: 0 % (ref 0.0–0.2)

## 2021-01-24 MED ORDER — METHYLERGONOVINE MALEATE 0.2 MG PO TABS
0.2000 mg | ORAL_TABLET | Freq: Four times a day (QID) | ORAL | Status: DC
  Administered 2021-01-24 – 2021-01-25 (×5): 0.2 mg via ORAL
  Filled 2021-01-24 (×6): qty 1

## 2021-01-24 NOTE — Progress Notes (Signed)
POD #2  s/p Primary C/S  S: notes cramping with BRF  initial blood noted per vagina but now scant Passing flatus. Denies lightheadedness or dizziness O:  Patient Vitals for the past 24 hrs:  BP Temp Temp src Pulse Resp SpO2  01/24/21 0626 108/75 -- -- 75 18 99 %  01/24/21 0618 -- 98.6 F (37 C) Oral -- -- --  01/23/21 2137 112/72 98.3 F (36.8 C) Oral 62 17 97 %  01/23/21 1724 103/68 98.2 F (36.8 C) Oral 68 16 98 %  lungs clear to A Cor RRR  abdomen: uterus 3-4 FB above umb firm Pelvic: cervix ft posterior to presenting uterine fibroid (+) dark red blood thereafter extr  +1 edema  IMP: pod #2 s/p C/S Fibroid uterus with LUS fibroid obstructing cervix P) check cbc( make sure no hidden excess bleeding) Start methergine Remain inpt  anticipate d/c home in am ,

## 2021-01-25 MED ORDER — OXYCODONE HCL 5 MG PO TABS: 5.0000 mg | ORAL_TABLET | ORAL | 0 refills | Status: AC | PRN

## 2021-01-25 MED ORDER — METHYLERGONOVINE MALEATE 0.2 MG PO TABS: 0.2000 mg | ORAL_TABLET | Freq: Four times a day (QID) | ORAL | 0 refills | Status: AC

## 2021-01-25 MED ORDER — IBUPROFEN 600 MG PO TABS: 600.0000 mg | ORAL_TABLET | Freq: Four times a day (QID) | ORAL | 11 refills | Status: AC | PRN

## 2021-01-25 NOTE — Discharge Summary (Signed)
Postpartum Discharge Summary  Date of Service updated     Patient Name: Rachel Lawrence DOB: [birth year] MRN: [id number]  Date of admission: 01/22/2021 Delivery date:01/22/2021  Delivering provider: Dalin Caldera, Alanda Slim  Date of discharge: 01/25/2021  Admitting diagnosis: Malpresentation, term gestation. Fibroid uterus Intrauterine pregnancy: [redacted]w[redacted]d    Secondary diagnosis:  IVF pregnancy, IDA, AMA Additional problems: none   Discharge diagnosis: Term Pregnancy Delivered, Anemia, and Fibroid uterus, AMA                                               Post partum procedures: none Augmentation: N/A Complications: None  Hospital course: Sceduled C/S   45y.o. yo G3P3003 at 324w3das admitted to the hospital 01/22/2021 for scheduled cesarean section with the following indication:Malpresentation, Elective Primary, and uterine fibroid .Delivery details are as follows:  Membrane Rupture Time/Date: 12:19 PM ,01/22/2021   Delivery Method:C-Section, Classical  Details of operation can be found in separate operative note.  Patient had an uncomplicated postpartum course.  She is ambulating, tolerating a regular diet, passing flatus, and urinating well. Patient is discharged home in stable condition on  01/25/2021        Newborn Data: Birth date:01/22/2021  Birth time:12:20 PM  Gender:Female  Living status:Living  Apgars:9 ,10  Weight:3.47 kg     Magnesium Sulfate received: No BMZ received: No Rhophylac:N/A MMR:No T-DaP:Given prenatally Flu: No Transfusion:No  Physical exam  Vitals:   01/24/21 0626 01/24/21 1452 01/24/21 2307 01/25/21 0522  BP: 108/75 122/65 121/74 129/76  Pulse: 75 68 65 62  Resp: 18 16 18 18   Temp:  98.7 F (37.1 C) 98.1 F (36.7 C) 98.4 F (36.9 C)  TempSrc:  Oral Oral Oral  SpO2: 99% 98% 99% 99%  Weight:      Height:       General: alert, cooperative, and no distress Lochia: appropriate Uterine Fundus: firm Incision: Dressing is clean, dry, and intact DVT  Evaluation: No evidence of DVT seen on physical exam. Calf/Ankle edema is present Labs: Lab Results  Component Value Date   WBC 14.5 (H) 01/24/2021   HGB 9.3 (L) 01/24/2021   HCT 30.4 (L) 01/24/2021   MCV 70.5 (L) 01/24/2021   PLT 190 01/24/2021   No flowsheet data found. Edinburgh Score: Edinburgh Postnatal Depression Scale Screening Tool 01/24/2021  I have been able to laugh and see the funny side of things. 0  I have looked forward with enjoyment to things. 0  I have blamed myself unnecessarily when things went wrong. 0  I have been anxious or worried for no good reason. 0  I have felt scared or panicky for no good reason. 0  Things have been getting on top of me. 0  I have been so unhappy that I have had difficulty sleeping. 0  I have felt sad or miserable. 0  I have been so unhappy that I have been crying. 0  The thought of harming myself has occurred to me. 0  Edinburgh Postnatal Depression Scale Total 0      After visit meds:  Allergies as of 01/25/2021   No Known Allergies      Medication List     TAKE these medications    ibuprofen 600 MG tablet Commonly known as: ADVIL Take 1 tablet (600 mg total) by mouth every 6 (six) hours  as needed.   iron polysaccharides 150 MG capsule Commonly known as: NIFEREX Take 150 mg by mouth daily.   methylergonovine 0.2 MG tablet Commonly known as: METHERGINE Take 1 tablet (0.2 mg total) by mouth 4 (four) times daily for 2 days.   multivitamin-prenatal 27-0.8 MG Tabs tablet Take 1 tablet by mouth daily at 12 noon. 1 tablet QD   oxyCODONE 5 MG immediate release tablet Commonly known as: Oxy IR/ROXICODONE Take 1-2 tablets (5-10 mg total) by mouth every 4 (four) hours as needed for up to 7 days for moderate pain.         Discharge home in stable condition Infant Feeding: Breast Infant Disposition:home with mother Discharge instruction: per After Visit Summary and Postpartum booklet. Activity: Advance as tolerated.  Pelvic rest for 6 weeks.  Diet: routine diet Anticipated Birth Control:  none Postpartum Appointment:6 weeks Additional Postpartum F/U:  none Future Appointments:No future appointments. Follow up Visit:  Follow-up Information     Servando Salina, MD Follow up in 6 week(s).   Specialty: Obstetrics and Gynecology Contact information: [address] [place] [address] [phone]                     01/25/2021 Marvene Staff, MD

## 2021-01-25 NOTE — Progress Notes (Signed)
SVD: primary  S:  Pt reports feeling well. Have not noted much vaginal bleeding/ Tolerating po/ Voiding without problems/ No n/v/ Bleeding is light/ Pain controlled withprescription NSAID's including ibuprofen (Motrin)    O:  A & O x 3 / VS: Blood pressure 129/76, pulse 62, temperature 98.4 F (36.9 C), temperature source Oral, resp. rate 18, height 5\' 1"  (1.549 m), weight 70.3 kg, SpO2 99 %, unknown if currently breastfeeding.  LABS: No results found for this or any previous visit (from the past 24 hour(s)).  I&O: No intake/output data recorded.   No intake/output data recorded.  Lungs: chest clear, no wheezing, rales, normal symmetric air entry  Heart: regular rate and rhythm, S1, S2 normal, no murmur, click, rub or gallop  Abdomen: soft uterus 3FB above. firm Active BS  Perineum: not inspected  Lochia: light  Extremities:no redness or tenderness in the calves or thighs, edema 1-2+   CBC Latest Ref Rng & Units 01/24/2021 01/23/2021 01/21/2021  WBC 4.0 - 10.5 K/uL 14.5(H) 22.3(H) 10.8(H)  Hemoglobin 12.0 - 15.0 g/dL 9.3(L) 9.1(L) 11.6(L)  Hematocrit 36.0 - 46.0 % 30.4(L) 28.8(L) 37.8  Platelets 150 - 400 K/uL 190 159 165     A/P: POD # 3/PPD # 3/ [id number] s/p prim C/S IDA Fibroid uterus  Doing well  Continue routine post partum orders  D/c instructions reviewed Scripts sent to pharm  Cont oral iron supplement D/c home

## 2021-01-25 NOTE — Discharge Instructions (Signed)
Call if temperature greater than equal to 100.4, nothing per vagina for 4-6 weeks or severe nausea vomiting, increased incisional pain , drainage or redness in the incision site, no straining with bowel movements, showers no bath °

## 2021-01-26 MED FILL — Heparin Sodium (Porcine) Inj 1000 Unit/ML: INTRAMUSCULAR | Qty: 30 | Status: AC

## 2021-01-26 MED FILL — Sodium Chloride IV Soln 0.9%: INTRAVENOUS | Qty: 1000 | Status: AC

## 2021-10-20 ENCOUNTER — Encounter: Payer: Self-pay | Admitting: Internal Medicine

## 2022-02-15 ENCOUNTER — Encounter: Payer: Self-pay | Admitting: Gastroenterology

## 2022-03-06 ENCOUNTER — Ambulatory Visit (AMBULATORY_SURGERY_CENTER): Payer: Self-pay | Admitting: *Deleted

## 2022-03-06 VITALS — Ht 62.0 in | Wt 140.0 lb

## 2022-03-06 DIAGNOSIS — Z1211 Encounter for screening for malignant neoplasm of colon: Secondary | ICD-10-CM

## 2022-03-06 MED ORDER — NA SULFATE-K SULFATE-MG SULF 17.5-3.13-1.6 GM/177ML PO SOLN: 2.0000 | Freq: Once | ORAL | 0 refills | Status: AC

## 2022-03-06 NOTE — Progress Notes (Signed)
No egg or soy allergy known to patient  No issues known to pt with past sedation with any surgeries or procedures Patient denies ever being told they had issues or difficulty with intubation  No FH of Malignant Hyperthermia Pt is not on diet pills Pt is not on  home 02  Pt is not on blood thinners  Pt denies issues with constipation  No A fib or A flutter   Discussed with pt there will be an out-of-pocket cost for prep and that varies from $0 to 70 +  dollars - pt verbalized understanding    PV completed in person. Pt verified name, DOB.  Procedure explained to pt. Prep instructions reviewed, questions answered. Pt encouraged to call with questions or issues.  If pt has My chart, procedure instructions sent via My Chart

## 2022-04-03 ENCOUNTER — Encounter: Payer: Self-pay | Admitting: Gastroenterology

## 2022-04-06 ENCOUNTER — Telehealth: Payer: Self-pay | Admitting: Gastroenterology

## 2022-04-06 NOTE — Telephone Encounter (Signed)
Good Morning Dr.Beavers,  Patient called stating that she needed to cancel her colonoscopy with you tomorrow morning at 10:30 due to a scheduling conflict.   Patient was rescheduled for 9/26 at 11:00.

## 2022-04-07 ENCOUNTER — Encounter: Payer: BC Managed Care – PPO | Admitting: Gastroenterology

## 2022-05-23 ENCOUNTER — Ambulatory Visit (AMBULATORY_SURGERY_CENTER): Payer: BC Managed Care – PPO | Admitting: Gastroenterology

## 2022-05-23 ENCOUNTER — Encounter: Payer: Self-pay | Admitting: Gastroenterology

## 2022-05-23 VITALS — BP 105/78 | HR 72 | Temp 96.6°F | Resp 16 | Ht 61.0 in | Wt 140.0 lb

## 2022-05-23 DIAGNOSIS — Z1211 Encounter for screening for malignant neoplasm of colon: Secondary | ICD-10-CM | POA: Diagnosis present

## 2022-05-23 MED ORDER — SODIUM CHLORIDE 0.9 % IV SOLN: 500.0000 mL | Freq: Once | INTRAVENOUS | Status: DC

## 2022-05-23 NOTE — Progress Notes (Signed)
   Referring Provider: [facility] Physician:  Velna Hatchet, MD   Indication for Colonoscopy:  Colon cancer screening   IMPRESSION:  Need for colon cancer screening Appropriate candidate for monitored anesthesia care  PLAN: Colonoscopy in the [facility] today   HPI: Rachel Lawrence is a 46 y.o. female presents for screening colonoscopy.  No prior colonoscopy or colon cancer screening.  No known family history of colon cancer or polyps. No family history of uterine/endometrial cancer, pancreatic cancer or gastric/stomach cancer.   Past Medical History:  Diagnosis Date   Asthma    Fibroid     Past Surgical History:  Procedure Laterality Date   CESAREAN SECTION N/A 01/22/2021   Procedure: CESAREAN SECTION;  Surgeon: Servando Salina, MD;  Location: [facility] LD ORS;  Service: Obstetrics;  Laterality: N/A;   plantar foot surg     WISDOM TOOTH EXTRACTION  2005    Current Outpatient Medications  Medication Sig Dispense Refill   Cholecalciferol (VITAMIN D) 125 MCG (5000 UT) CAPS Take by mouth.     ibuprofen (ADVIL) 600 MG tablet Take 1 tablet (600 mg total) by mouth every 6 (six) hours as needed. (Patient not taking: Reported on 03/06/2022) 30 tablet 11   iron polysaccharides (NIFEREX) 150 MG capsule Take 150 mg by mouth daily. (Patient not taking: Reported on 03/06/2022)     Prenatal Vit-Fe Fumarate-FA (MULTIVITAMIN-PRENATAL) 27-0.8 MG TABS tablet Take 1 tablet by mouth daily at 12 noon. 1 tablet QD (Patient not taking: Reported on 03/06/2022)     No current facility-administered medications for this visit.    Allergies as of 05/23/2022   (No Known Allergies)    Family History  Problem Relation Age of Onset   Cancer Mother    Hypertension Mother    Cancer Maternal Aunt    Diabetes Maternal Aunt    Colon polyps Neg Hx    Colon cancer Neg Hx    Esophageal cancer Neg Hx    Stomach cancer Neg Hx    Rectal cancer Neg Hx      Physical Exam: General:    Alert,  well-nourished, pleasant and cooperative in NAD Head:  Normocephalic and atraumatic. Eyes:  Sclera clear, no icterus.   Conjunctiva pink. Mouth:  No deformity or lesions.   Neck:  Supple; no masses or thyromegaly. Lungs:  Clear throughout to auscultation.   No wheezes. Heart:  Regular rate and rhythm; no murmurs. Abdomen:  Soft, non-tender, nondistended, normal bowel sounds, no rebound or guarding.  Msk:  Symmetrical. No boney deformities LAD: No inguinal or umbilical LAD Extremities:  No clubbing or edema. Neurologic:  Alert and  oriented x4;  grossly nonfocal Skin:  No obvious rash or bruise. Psych:  Alert and cooperative. Normal mood and affect.     Studies/Results: No results found.    [name] L. Tarri Glenn, MD, MPH 05/23/2022, 10:18 AM

## 2022-05-23 NOTE — Patient Instructions (Signed)
Patient has a contact number available for emergencies. The signs and symptoms of potential delayed complications were discussed with the patient. Return to normal activities tomorrow. Written discharge instructions were provided to the patient. - Resume previous diet. - Continue present medications. - Repeat colonoscopy in 10 years for surveillance, earlier with new symptoms. - Emerging evidence supports eating a diet of fruits, vegetables, grains, calcium, and yogurt while reducing red meat and alcohol may reduce the risk of colon cancer. - Thank you for allowing me to be involved in your colon cancer prevention.  YOU HAD AN ENDOSCOPIC PROCEDURE TODAY AT [facility]:   Refer to the procedure report that was given to you for any specific questions about what was found during the examination.  If the procedure report does not answer your questions, please call your gastroenterologist to clarify.  If you requested that your care partner not be given the details of your procedure findings, then the procedure report has been included in a sealed envelope for you to review at your convenience later.  YOU SHOULD EXPECT: Some feelings of bloating in the abdomen. Passage of more gas than usual.  Walking can help get rid of the air that was put into your GI tract during the procedure and reduce the bloating. If you had a lower endoscopy (such as a colonoscopy or flexible sigmoidoscopy) you may notice spotting of blood in your stool or on the toilet paper. If you underwent a bowel prep for your procedure, you may not have a normal bowel movement for a few days.  Please Note:  You might notice some irritation and congestion in your nose or some drainage.  This is from the oxygen used during your procedure.  There is no need for concern and it should clear up in a day or so.  SYMPTOMS TO REPORT IMMEDIATELY:  Following lower endoscopy (colonoscopy or flexible sigmoidoscopy):  Excessive amounts  of blood in the stool  Significant tenderness or worsening of abdominal pains  Swelling of the abdomen that is new, acute  Fever of 100F or higher   For urgent or emergent issues, a gastroenterologist can be reached at any hour by calling [phone]. Do not use MyChart messaging for urgent concerns.    DIET:  We do recommend a small meal at first, but then you may proceed to your regular diet.  Drink plenty of fluids but you should avoid alcoholic beverages for 24 hours.  ACTIVITY:  You should plan to take it easy for the rest of today and you should NOT DRIVE or use heavy machinery until tomorrow (because of the sedation medicines used during the test).    FOLLOW UP: Our staff will call the number listed on your records the next business day following your procedure.  We will call around 7:15- 8:00 am to check on you and address any questions or concerns that you may have regarding the information given to you following your procedure. If we do not reach you, we will leave a message.     If any biopsies were taken you will be contacted by phone or by letter within the next 1-3 weeks.  Please call us at [phone] if you have not heard about the biopsies in 3 weeks.    SIGNATURES/CONFIDENTIALITY: You and/or your care partner have signed paperwork which will be entered into your electronic medical record.  These signatures attest to the fact that that the information above on your After Visit Summary  has been reviewed and is understood.  Full responsibility of the confidentiality of this discharge information lies with you and/or your care-partner.

## 2022-05-23 NOTE — Progress Notes (Signed)
Pt's states no medical or surgical changes since previsit or office visit. 

## 2022-05-23 NOTE — Op Note (Signed)
[facility] Patient Name: Rachel Lawrence Procedure Date: 05/23/2022 10:49 AM MRN: [id number] Endoscopist: Thornton Park MD, MD Age: 46 Referring MD:  Date of Birth: 07-06-1976 Gender: Female Account #: [id number] Procedure:                Colonoscopy Indications:              Screening for colorectal malignant neoplasm, This                            is the patient's first colonoscopy                           No known family history of colon cancer or polyps Medicines:                Monitored Anesthesia Care Procedure:                Pre-Anesthesia Assessment:                           - Prior to the procedure, a History and Physical                            was performed, and patient medications and                            allergies were reviewed. The patient's tolerance of                            previous anesthesia was also reviewed. The risks                            and benefits of the procedure and the sedation                            options and risks were discussed with the patient.                            All questions were answered, and informed consent                            was obtained. Prior Anticoagulants: The patient has                            taken no previous anticoagulant or antiplatelet                            agents. ASA Grade Assessment: II - A patient with                            mild systemic disease. After reviewing the risks                            and benefits, the patient was deemed in  satisfactory condition to undergo the procedure.                           After obtaining informed consent, the colonoscope                            was passed under direct vision. Throughout the                            procedure, the patient's blood pressure, pulse, and                            oxygen saturations were monitored continuously. The                            Olympus PCF-H190DL  (#[id number]) Colonoscope was                            introduced through the anus and advanced to the the                            cecum, identified by appendiceal orifice and                            ileocecal valve. A second forward view of the right                            colon was performed. The colonoscopy was performed                            without difficulty. The patient tolerated the                            procedure well. The quality of the bowel                            preparation was good. The ileocecal valve,                            appendiceal orifice, and rectum were photographed. Scope In: 11:03:38 AM Scope Out: 11:17:04 AM Scope Withdrawal Time: 0 hours 10 minutes 30 seconds  Total Procedure Duration: 0 hours 13 minutes 26 seconds  Findings:                 The perianal and digital rectal examinations were                            normal.                           Non-bleeding internal hemorrhoids were found.                           The exam was otherwise without abnormality on  direct and retroflexion views. Complications:            No immediate complications. Estimated Blood Loss:     Estimated blood loss: none. Impression:               - Non-bleeding internal hemorrhoids.                           - The examination was otherwise normal on direct                            and retroflexion views.                           - No specimens collected. Recommendation:           - Patient has a contact number available for                            emergencies. The signs and symptoms of potential                            delayed complications were discussed with the                            patient. Return to normal activities tomorrow.                            Written discharge instructions were provided to the                            patient.                           - Resume previous diet.                            - Continue present medications.                           - Repeat colonoscopy in 10 years for surveillance,                            earlier with new symptoms.                           - Emerging evidence supports eating a diet of                            fruits, vegetables, grains, calcium, and yogurt                            while reducing red meat and alcohol may reduce the                            risk of colon cancer.                           -  Thank you for allowing me to be involved in your                            colon cancer prevention. Thornton Park MD, MD 05/23/2022 11:21:39 AM This report has been signed electronically.

## 2022-05-23 NOTE — Progress Notes (Signed)
To pacu, VSS. Report to [email or web address] 

## 2022-05-24 ENCOUNTER — Telehealth: Payer: Self-pay | Admitting: *Deleted

## 2022-05-24 NOTE — Telephone Encounter (Signed)
  Follow up Call-     05/23/2022   10:34 AM  Call back number  Post procedure Call Back phone  # [phone]  Permission to leave phone message Yes     Patient questions:  Do you have a fever, pain , or abdominal swelling? No. Pain Score  0 *  Have you tolerated food without any problems? Yes.    Have you been able to return to your normal activities? Yes.    Do you have any questions about your discharge instructions: Diet   No. Medications  No. Follow up visit  No.  Do you have questions or concerns about your Care? No.  Actions: * If pain score is 4 or above: No action needed, pain <4.
